# Patient Record
Sex: Male | Born: 1980 | Race: White | Hispanic: No | Marital: Single | Smoking: Current every day smoker
Health system: Southern US, Community
[De-identification: ages and names within clinical notes are randomized; demographics above are authoritative.]

## PROBLEM LIST (undated history)

## (undated) ENCOUNTER — Emergency Department: Admission: EM | Payer: Self-pay | Source: Home / Self Care

## (undated) DIAGNOSIS — J45909 Unspecified asthma, uncomplicated: Secondary | ICD-10-CM

## (undated) DIAGNOSIS — S022XXA Fracture of nasal bones, initial encounter for closed fracture: Secondary | ICD-10-CM

## (undated) DIAGNOSIS — M179 Osteoarthritis of knee, unspecified: Secondary | ICD-10-CM

## (undated) DIAGNOSIS — K5792 Diverticulitis of intestine, part unspecified, without perforation or abscess without bleeding: Secondary | ICD-10-CM

## (undated) DIAGNOSIS — R600 Localized edema: Secondary | ICD-10-CM

## (undated) DIAGNOSIS — I10 Essential (primary) hypertension: Secondary | ICD-10-CM

## (undated) DIAGNOSIS — F419 Anxiety disorder, unspecified: Secondary | ICD-10-CM

## (undated) DIAGNOSIS — M1990 Unspecified osteoarthritis, unspecified site: Secondary | ICD-10-CM

## (undated) DIAGNOSIS — Z720 Tobacco use: Secondary | ICD-10-CM

## (undated) HISTORY — PX: SHOULDER ARTHROSCOPY: SHX128

## (undated) HISTORY — PX: CHOLECYSTECTOMY: SHX55

---

## 2004-04-10 ENCOUNTER — Emergency Department: Payer: Self-pay | Admitting: Emergency Medicine

## 2005-10-27 ENCOUNTER — Emergency Department: Payer: Self-pay | Admitting: Emergency Medicine

## 2007-06-30 ENCOUNTER — Emergency Department: Payer: Self-pay | Admitting: Emergency Medicine

## 2008-02-06 ENCOUNTER — Emergency Department: Payer: Self-pay | Admitting: Emergency Medicine

## 2009-02-22 ENCOUNTER — Emergency Department: Payer: Self-pay | Admitting: Emergency Medicine

## 2009-04-06 ENCOUNTER — Emergency Department: Payer: Self-pay | Admitting: Internal Medicine

## 2009-10-17 ENCOUNTER — Emergency Department: Payer: Self-pay | Admitting: Emergency Medicine

## 2009-10-26 ENCOUNTER — Emergency Department: Payer: Self-pay | Admitting: Emergency Medicine

## 2010-01-09 ENCOUNTER — Emergency Department: Payer: Self-pay | Admitting: Emergency Medicine

## 2010-03-24 ENCOUNTER — Emergency Department: Payer: Self-pay | Admitting: Emergency Medicine

## 2010-04-04 ENCOUNTER — Emergency Department: Payer: Self-pay | Admitting: Emergency Medicine

## 2011-09-12 ENCOUNTER — Emergency Department: Payer: Self-pay | Admitting: Emergency Medicine

## 2011-09-13 ENCOUNTER — Emergency Department: Payer: Self-pay | Admitting: Internal Medicine

## 2012-04-14 ENCOUNTER — Emergency Department: Payer: Self-pay | Admitting: Emergency Medicine

## 2012-07-21 ENCOUNTER — Emergency Department: Payer: Self-pay | Admitting: Emergency Medicine

## 2012-07-21 LAB — URINALYSIS, COMPLETE
Bacteria: NONE SEEN
Blood: NEGATIVE
Ketone: NEGATIVE
Leukocyte Esterase: NEGATIVE
Ph: 8 (ref 4.5–8.0)
Protein: NEGATIVE
Specific Gravity: 1.02 (ref 1.003–1.030)

## 2012-07-21 LAB — CBC
HCT: 44.3 % (ref 40.0–52.0)
MCH: 31.5 pg (ref 26.0–34.0)
MCHC: 34.6 g/dL (ref 32.0–36.0)
Platelet: 232 10*3/uL (ref 150–440)
WBC: 13.9 10*3/uL — ABNORMAL HIGH (ref 3.8–10.6)

## 2012-07-21 LAB — COMPREHENSIVE METABOLIC PANEL
Albumin: 3.9 g/dL (ref 3.4–5.0)
Alkaline Phosphatase: 108 U/L (ref 50–136)
BUN: 13 mg/dL (ref 7–18)
Calcium, Total: 8.8 mg/dL (ref 8.5–10.1)
Chloride: 106 mmol/L (ref 98–107)
Co2: 28 mmol/L (ref 21–32)
Creatinine: 0.91 mg/dL (ref 0.60–1.30)
EGFR (Non-African Amer.): >60
Glucose: 174 mg/dL — ABNORMAL HIGH (ref 65–99)
Osmolality: 282 (ref 275–301)
Potassium: 4.2 mmol/L (ref 3.5–5.1)
SGPT (ALT): 141 U/L — ABNORMAL HIGH (ref 12–78)

## 2012-09-08 ENCOUNTER — Emergency Department: Payer: Self-pay | Admitting: Emergency Medicine

## 2012-09-08 LAB — COMPREHENSIVE METABOLIC PANEL
Albumin: 3.8 g/dL (ref 3.4–5.0)
Alkaline Phosphatase: 119 U/L (ref 50–136)
Calcium, Total: 9 mg/dL (ref 8.5–10.1)
Chloride: 110 mmol/L — ABNORMAL HIGH (ref 98–107)
EGFR (African American): >60
Glucose: 135 mg/dL — ABNORMAL HIGH (ref 65–99)
Osmolality: 283 (ref 275–301)
SGOT(AST): 57 U/L — ABNORMAL HIGH (ref 15–37)
SGPT (ALT): 101 U/L — ABNORMAL HIGH (ref 12–78)

## 2012-09-08 LAB — URINALYSIS, COMPLETE
Bacteria: NONE SEEN
Blood: NEGATIVE
Ketone: NEGATIVE
Leukocyte Esterase: NEGATIVE
Protein: NEGATIVE
Specific Gravity: 1.02 (ref 1.003–1.030)
WBC UR: 1 /HPF (ref 0–5)

## 2012-09-08 LAB — CBC
MCHC: 35 g/dL (ref 32.0–36.0)
MCV: 90 fL (ref 80–100)
Platelet: 205 10*3/uL (ref 150–440)
RBC: 4.77 10*6/uL (ref 4.40–5.90)

## 2012-10-18 ENCOUNTER — Emergency Department: Payer: Self-pay | Admitting: Emergency Medicine

## 2013-04-14 ENCOUNTER — Emergency Department: Payer: Self-pay | Admitting: Emergency Medicine

## 2013-06-19 ENCOUNTER — Emergency Department: Payer: Self-pay | Admitting: Internal Medicine

## 2013-06-23 LAB — BETA STREP CULTURE(ARMC)

## 2013-11-14 ENCOUNTER — Emergency Department: Payer: Self-pay | Admitting: Emergency Medicine

## 2013-11-14 LAB — BASIC METABOLIC PANEL
Anion Gap: 7 (ref 7–16)
BUN: 19 mg/dL — ABNORMAL HIGH (ref 7–18)
CHLORIDE: 107 mmol/L (ref 98–107)
CO2: 27 mmol/L (ref 21–32)
Calcium, Total: 8.9 mg/dL (ref 8.5–10.1)
Creatinine: 1.16 mg/dL (ref 0.60–1.30)
EGFR (African American): >60
EGFR (Non-African Amer.): >60
Glucose: 108 mg/dL — ABNORMAL HIGH (ref 65–99)
Osmolality: 284 (ref 275–301)
Potassium: 3.7 mmol/L (ref 3.5–5.1)
Sodium: 141 mmol/L (ref 136–145)

## 2013-11-14 LAB — CBC
HCT: 43.8 % (ref 40.0–52.0)
HGB: 14.6 g/dL (ref 13.0–18.0)
MCH: 31 pg (ref 26.0–34.0)
MCHC: 33.4 g/dL (ref 32.0–36.0)
MCV: 93 fL (ref 80–100)
PLATELETS: 184 10*3/uL (ref 150–440)
RBC: 4.72 10*6/uL (ref 4.40–5.90)
RDW: 12.7 % (ref 11.5–14.5)
WBC: 9.8 10*3/uL (ref 3.8–10.6)

## 2013-11-14 LAB — TROPONIN I

## 2013-11-15 LAB — HEPATIC FUNCTION PANEL A (ARMC)
ALT: 139 U/L — AB (ref 12–78)
Albumin: 4.1 g/dL (ref 3.4–5.0)
Alkaline Phosphatase: 104 U/L
Bilirubin,Total: 0.6 mg/dL (ref 0.2–1.0)
SGOT(AST): 66 U/L — ABNORMAL HIGH (ref 15–37)
Total Protein: 7.5 g/dL (ref 6.4–8.2)

## 2013-11-15 LAB — TROPONIN I: Troponin-I: <0.02 ng/mL

## 2013-11-15 LAB — LIPASE, BLOOD: LIPASE: 225 U/L (ref 73–393)

## 2013-11-16 ENCOUNTER — Observation Stay: Payer: Self-pay | Admitting: Surgery

## 2013-11-16 LAB — COMPREHENSIVE METABOLIC PANEL
Albumin: 3.5 g/dL (ref 3.4–5.0)
Alkaline Phosphatase: 99 U/L
Anion Gap: 6 — ABNORMAL LOW (ref 7–16)
BUN: 13 mg/dL (ref 7–18)
Bilirubin,Total: 0.5 mg/dL (ref 0.2–1.0)
Calcium, Total: 8.4 mg/dL — ABNORMAL LOW (ref 8.5–10.1)
Chloride: 108 mmol/L — ABNORMAL HIGH (ref 98–107)
Co2: 28 mmol/L (ref 21–32)
Creatinine: 0.85 mg/dL (ref 0.60–1.30)
EGFR (African American): >60
EGFR (Non-African Amer.): >60
GLUCOSE: 87 mg/dL (ref 65–99)
Osmolality: 283 (ref 275–301)
Potassium: 3.9 mmol/L (ref 3.5–5.1)
SGOT(AST): 55 U/L — ABNORMAL HIGH (ref 15–37)
SGPT (ALT): 116 U/L — ABNORMAL HIGH (ref 12–78)
Sodium: 142 mmol/L (ref 136–145)
Total Protein: 6.9 g/dL (ref 6.4–8.2)

## 2013-11-16 LAB — CBC WITH DIFFERENTIAL/PLATELET
BASOS ABS: 0.1 10*3/uL (ref 0.0–0.1)
Basophil %: 0.9 %
Eosinophil #: 0.8 10*3/uL — ABNORMAL HIGH (ref 0.0–0.7)
Eosinophil %: 9.3 %
HCT: 43.9 % (ref 40.0–52.0)
HGB: 14.5 g/dL (ref 13.0–18.0)
LYMPHS PCT: 26.6 %
Lymphocyte #: 2.4 10*3/uL (ref 1.0–3.6)
MCH: 30.6 pg (ref 26.0–34.0)
MCHC: 33.1 g/dL (ref 32.0–36.0)
MCV: 93 fL (ref 80–100)
MONO ABS: 0.7 x10 3/mm (ref 0.2–1.0)
Monocyte %: 8.3 %
NEUTROS PCT: 54.9 %
Neutrophil #: 4.9 10*3/uL (ref 1.4–6.5)
Platelet: 178 10*3/uL (ref 150–440)
RBC: 4.74 10*6/uL (ref 4.40–5.90)
RDW: 12.7 % (ref 11.5–14.5)
WBC: 8.9 10*3/uL (ref 3.8–10.6)

## 2013-11-18 LAB — HEPATIC FUNCTION PANEL A (ARMC)
ALBUMIN: 3.2 g/dL — AB (ref 3.4–5.0)
ALT: 143 U/L — AB
AST: 86 U/L — AB (ref 15–37)
Alkaline Phosphatase: 91 U/L
Bilirubin, Direct: <0.1 mg/dL (ref 0.00–0.20)
Bilirubin,Total: 0.4 mg/dL (ref 0.2–1.0)
TOTAL PROTEIN: 6.3 g/dL — AB (ref 6.4–8.2)

## 2013-11-18 LAB — CLOSTRIDIUM DIFFICILE(ARMC)

## 2013-11-19 LAB — WBCS, STOOL

## 2013-11-20 LAB — CREATININE, SERUM
Creatinine: 0.99 mg/dL (ref 0.60–1.30)
EGFR (African American): >60
EGFR (Non-African Amer.): >60

## 2013-11-24 LAB — PATHOLOGY REPORT

## 2014-04-24 ENCOUNTER — Emergency Department: Payer: Self-pay | Admitting: Emergency Medicine

## 2014-05-25 ENCOUNTER — Emergency Department: Payer: Self-pay | Admitting: Emergency Medicine

## 2014-08-20 NOTE — H&P (Signed)
Subjective/Chief Complaint epigastric abdominal pain and chest pain   History of Present Illness 34 y/o obese Wm presents to ER this weekend with sudden onset severe epigastric pain radiating into his sternal area and then later into mid-scapular region.  no fevers, no jaundice, sick contacts.  Not really any better since discharge from ER.  He has developed frequent diarrhea since ER 5-10 times per day. no blood or mucus.  He describes a similar episode of abdominal pain several months ago which resolved on its own.  He has a hx of diverticulitis dating back to 2014 treated as outpatient.  Endorses some nausea as well.  W/U in ER with US and lab work shows slightly elevated AST and ALT normal bilirubin with a contracted gallbladder containing multiple echogenic non-shadowing nonmobile.  CBS was 8 mm.  WBC was 9.8 platelet count 184,000 electrolytes unremarkable.   Past History obesity Diverticulitis Cholelithiasis Tobacco abuse and dependence. asthma   Past Med/Surgical Hx:  Hypertension:   asthma:   Shoulder Surgery - Right: orthocopic surgery  ALLERGIES:  Hydrocodone/ APAP: Itching, Hives, Rash  HOME MEDICATIONS: Medication Instructions Status  traMADol 50 mg oral tablet 1 tab(s) orally every 6 hours as needed for pain Active  lisinopril 10 mg oral tablet 1 tab(s) orally once a day Active  meloxicam 7.5 mg oral tablet 1 tab(s) orally once a day Active  pantoprazole 40 mg oral delayed release tablet 1 tab(s) orally once a day Active   Family and Social History:  Family History Non-Contributory   Social History positive  tobacco, negative ETOH, negative Illicit drugs   + Tobacco Current (within 1 year)   Place of Living Home   Review of Systems:  Subjective/Chief Complaint see above   Sputum No   Abdominal Pain Yes   Diarrhea Yes   Nausea/Vomiting Yes   SOB/DOE Yes   Physical Exam:  GEN obese, disheveled   HEENT pale conjunctivae, PERRL   NECK supple  No  masses   RESP normal resp effort  clear BS   CARD regular rate   ABD positive tenderness  no liver/spleen enlargement  no hernia  soft  distended  significant tenderness in the epigastric region.   EXTR negative cyanosis/clubbing, negative edema   SKIN normal to palpation, No rashes   NEURO cranial nerves intact   PSYCH A+O to time, place, person, good insight   Radiology Results: XRay:    19-Jul-15 22:43, Chest PA and Lateral  Chest PA and Lateral  REASON FOR EXAM:    chest pain  -  ed waiting room  COMMENTS:       PROCEDURE: DXR - DXR CHEST PA (OR AP) AND LATERAL  - Nov 14 2013 10:43PM     CLINICAL DATA:  Chest pain.    EXAM:  CHEST  2 VIEW    COMPARISON:  Chest radiograph from 02/22/2009    FINDINGS:  The lungs are well-aerated. Minimal left basilar opacity likely  reflects atelectasis. There is no evidence of pleural effusion or  pneumothorax.    The heart is normal in size; the mediastinal contour is within  normal limits. No acute osseous abnormalities are seen.     IMPRESSION:  Minimal left basilar opacity likely reflects atelectasis; lungs  otherwise clear.      Electronically Signed    By: Roanna RaiderJeffery  Chang M.D.    On: 11/14/2013 22:59       Verified By: JEFFREY . Cherly HensenHANG, M.D.,  US:    20-Jul-15  04:56, US Abdomen Limited Survey  US Abdomen Limited Survey  REASON FOR EXAM:    ruq pain  COMMENTS:   Body Site: Gallbladder, Liver, Common Bile Duct    PROCEDURE: [place]  - US ABDOMEN LIMITED SURVEY  - Nov 15 2013  4:56AM     CLINICAL DATA:  Right upper quadrant pain    EXAM:  US ABDOMEN LIMITED - RIGHT UPPER QUADRANT    COMPARISON:  Prior CT from 09/08/2012    FINDINGS:  Gallbladder:  Gallbladder is contracted. No gallbladder wall thickening or  pericholecystic fluid. Multiple round echogenic nonmobile foci are  seen within the gallbladder lumen, some of which appear adherent to  the gallbladder wall. Findings may represent small stones or  possibly  polyps. The largest of these measured approximately 8 mm.  No associated vascularity. Sonographic Murphy's sign was absent.    Common bile duct:    Diameter: 8 mm.  No obstructive stone identified.    Liver:    No focal lesion identified. Within normal limits in parenchymal  echogenicity.   IMPRESSION:  1. Contracted gallbladder with multiple echogenic, non shadowing,  nonmobile foci within the gallbladder lumen, several of which appear  intimately associated with the gallbladder wall. Findings may  reflect gallbladder polyps and/or small stones. A short interval  follow-up study in 3-6 months is recommended to document stability.  2. No sonographic evidence of acute cholecystitis.  3. Mild dilatation of the common bile duct to 8 mm. No  choledocholithiasis or intrahepatic biliary dilatation identified.      Electronically Signed    By: Rise Mu M.D.    On: 11/15/2013 05:27     Verified By: Fonda Kinder, M.D.,  LabUnknown:    19-Jul-15 22:43, Chest PA and Lateral  PACS Image    20-Jul-15 04:56, US Abdomen Limited Survey  PACS Image    Assessment/Admission Diagnosis 34 year old male with obesity and epigastric pain and cholelithiasis.   Plan plan admission to the hospital hydration and intravenous fluids CT scan of the abdomen and I like to make sure he doesn't have peptic ulcer disease given his presentation.  I will discuss this with the day surgeon Dr. Michela Pitcher.   Electronic Signatures: Natale Lay (MD)  (Signed 21-Jul-15 11:03)  Authored: CHIEF COMPLAINT and HISTORY, PAST MEDICAL/SURGIAL HISTORY, ALLERGIES, HOME MEDICATIONS, FAMILY AND SOCIAL HISTORY, REVIEW OF SYSTEMS, PHYSICAL EXAM, Radiology, ASSESSMENT AND PLAN   Last Updated: 21-Jul-15 11:03 by Natale Lay (MD)

## 2014-08-20 NOTE — Consult Note (Signed)
PATIENT NAME:  Anthony [name], Anthony [name] MR#:  [id number] DATE OF BIRTH:  Dec 16, 1980  GASTROINTESTINAL CONSULTATION   DATE OF CONSULTATION:  11/17/2013  REFERRING PHYSICIAN:  Carmie End, [name] CONSULTING PHYSICIAN:  Anthony Arrow, NP  PRIMARY CARE PHYSICIAN: Anthony A. Lacie Scotts, [name]  CONSULTING GASTROENTEROLOGIST: Anthony Minium, [name]  REASON FOR CONSULTATION: Epigastric and chest pain and nausea.   HISTORY OF PRESENT ILLNESS: Anthony [name] is a 34 year old Caucasian male who was in his usual state of health, with history of chronic GERD, until 3 days ago, when he developed severe epigastric pain upon awakening. He reports the pain was initially a 3 out of 10 and became progressively worse throughout the day. The next morning, the pain was up to 9 out of 10. The pain did radiate to his back between his shoulder blades. He describes the pain as stabbing, and it is constant. It is better now, but worse with movement and breathing. It is not associated with eating. He does have nausea, without any vomiting. He has a history of chronic heartburn and indigestion that are well-controlled on pantoprazole 40 mg daily at home. He denies any history of EGD. He has had a couple of loose stools. The last couple days, he has just had 1 loose stool. He denies any rectal bleeding, melena or clay-colored stool. He denies any  dysphagia or odynophagia. He denies any jaundice or pruritus. He denies any fever or chills. He does take Goody's Powders about every other day for headaches. He denies any other NSAIDs. He did have an abdominal ultrasound on 11/15/2013 which showed an 8 mm common bile duct, contracted gallbladder with multiple echogenic, non-shadowing, nonmobile foci within the gallbladder lumen, several of which appear with gallbladder wall, questionable polyps versus small lithiasis, and short interval followup is recommended. There was no intrahepatic biliary dilatation. He had a CT scan of the abdomen and pelvis with  IV contrast on 11/16/2013 which was normal, with a normal gallbladder.   PAST MEDICAL AND SURGICAL HISTORY: Chronic GERD, asthma, hypertension, right shoulder surgery.   MEDICATIONS PRIOR TO ADMISSION:  1. Pantoprazole 40 mg daily. 2. Goody's Powders p.r.n. 3. Lisinopril 10 mg daily.  4. Meloxicam 7.5 mg daily.  5. Tramadol 50 mg q.6 hours p.r.n.   ALLERGIES: HYDROCODONE CAUSES HIVES AND PRURITUS.   FAMILY HISTORY: There is no family history of colorectal carcinoma or other chronic GI problems.   SOCIAL HISTORY: He lives with his fiancee. He has 2 healthy children. He is a Hospital doctor for [employer]. He has a 20-pack-year history of tobacco use, but denies any alcohol use. Denies any illicit drug use.   REVIEW OF SYSTEMS: See HPI. Otherwise, negative 10-point review of systems.   PHYSICAL EXAMINATION:  VITAL SIGNS: Temperature 97.6, pulse 68, respirations 18, blood pressure 115/72, O2 saturation 95% on room air, BMI is 42, his weight is 310, his height is 72 inches.  GENERAL: He is an obese Caucasian male who is alert, oriented, pleasant, cooperative, in no acute distress.  HEENT: Sclerae clear, nonicteric. Conjunctivae pink. Oropharynx pink and moist without any lesions.  NECK: Supple, without mass or thyromegaly.  CHEST: Heart regular rate and rhythm. Normal S1, S2. No murmurs, rubs, clicks or gallops.  LUNGS: Clear to auscultation bilaterally.  ABDOMEN: Protuberant. Positive bowel sounds x4. No bruits auscultated. Soft, nondistended. He does have mild tenderness to the epigastrium on deep palpation. He has Murphy's point tenderness. No rebound tenderness or guarding. No hepatosplenomegaly or mass. Exam is limited  given his body habitus.  EXTREMITIES: Without clubbing or edema bilaterally.  SKIN: Pink, warm and dry, without rash or jaundice.  NEUROLOGIC: Grossly intact.  MUSCULOSKELETAL: Good equal movement and strength bilaterally.  PSYCHIATRIC: Alert, cooperative, normal mood and  affect.   LABORATORY STUDIES: Chloride 108, anion gap 6, calcium 8.4, otherwise normal basic metabolic panel. His lipase was 225. Total protein 6.9, albumin 3.5, total bilirubin 0.5, alkaline phosphatase 99, AST 55, ALT 116. Troponin was negative. CBC was normal.   IMPRESSION: Anthony [name] is a very pleasant 34 year old Caucasian male with chronic gastroesophageal reflux disease, on proton pump inhibitor, and chronic Goody's Powder and meloxicam use, with acute onset of epigastric pain and nausea for the past 4 days. Abdominal ultrasound shows cholelithiasis and dilated common bile duct of 8 mm, without evidence of choledocholithiasis. His LFTs show mild bump in transaminases, normal alkaline phosphatase and bilirubin. CT was benign. He is going to need an EGD today with Anthony [name] to look for an upper gastrointestinal source of epigastric pain, like gastritis, peptic ulcer disease, eosinophilic esophagitis or poorly controlled gastroesophageal reflux disease. I have discussed the risks and benefits which include, but are not limited to bleeding, infection, perforation, drug reaction. He agrees with this plan, and consent was obtained. He may need further exploration of his dilated common bile duct as well, as he may have passed a stone, sludge or microlithiasis. He is n.p.o. His last p.o. intake was sips of water with pills this morning, and I have discussed his care with Dr. Midge Miniumarren [name].   PLAN:  1. N.p.o.  2. EGD with Anthony [name] today.  3. Continue supportive measures.  4. Agree with PPI daily. 5. Further recommendations to follow EGD.   ____________________________ Anthony ArrowKandice L. Willem Klingensmith, NP klj:lb D: 11/17/2013 11:33:11 ET T: 11/17/2013 11:54:30 ET JOB#: [id number]  cc: Anthony ArrowKandice L. Jull Harral, NP, <Dictator> Anthony [name] Anthony [name] ELECTRONICALLY SIGNED 11/21/2013 20:51

## 2014-08-20 NOTE — Consult Note (Signed)
Brief Consult Note: Diagnosis: epigastric pain & nausea.   Patient was seen by consultant.   Comments: Mr. Anthony [name] is a very pleasant 34 y/o caucasian male with chronic GERD on PPI & chronic Goody's powder use & meloxicam with acute onset epigastric pain & nausea for past 4 days.  Abdominal ultrasound shows cholelithiasis & dilated CBD 8mm.  LFTS show mild bump in transaminases, normal ALP & bilirubin, without evidence of choledocholithiasis.  EGD to look for upper GI source of epigastric pain like gastritis, PUD, eosinophilic esophagitis or poorly controlled GERD.  Discussed risks/benefits of procedure which include but are not limited to bleeding, infection, perforation & drug reaction.  Patient agrees with this plan & consent will be obtained. May need further exploration of dilated CBD as he may have passed stone, sludge or microlithiasis as well.  Pt NPO, last PO was sips of water with pills this AM.  I have discussed her care [facility]EN [facility] & our plan of care is below.   Plan: 1) NPO 2) EGD with Dr Servando SnareWohl today 3) supportive measures 4) PPI daily Thanks for allowing us to participate in his care.  Please see full dictated note. #[id number].  Electronic Signatures: Joselyn ArrowJones, Paisley Grajeda L (NP)  (Signed 22-Jul-15 11:33)  Authored: Brief Consult Note   Last Updated: 22-Jul-15 11:33 by Joselyn ArrowJones, Oluwatimileyin Vivier L (NP)

## 2014-08-20 NOTE — Discharge Summary (Signed)
PATIENT NAME:  Anthony HolsterFORTNER, Celestino E MR#:  [id number] DATE OF BIRTH:  07/08/1980  DATE OF ADMISSION:  11/16/2013 DATE OF DISCHARGE:  11/20/2013  BRIEF HISTORY: Raeanne Gathersorman Jorstad is a 34 year old gentleman admitted to the office on referral from the Emergency Room with abdominal pain and possible biliary tract disease. He had atypical biliary tract symptoms with primarily left lower quadrant pains, diarrhea, and subxiphoid pain.  Emergency Room evaluation demonstrated slightly elevated liver function studies and SGOT of 66 and SGPT 139. White blood cell count was not elevated.  Bilirubin and alkaline phosphatase ran elevated. Ultrasound did demonstrate a dilated common bile duct. He was admitted to the hospital for further evaluation through the office as his pain was not controlled with outpatient medications. CT scan did not demonstrate any evidence of significant pulmonary tract disease, but he continued to complain of discomfort. EGD was performed by the GI service, which did not reveal any evidence of significant esophageal, gastric, or duodenal problems. He underwent a HIDA scan which demonstrated normal filling of the gallbladder and normal emptying. However, the ultrasound demonstrated possible polyps, and then in view of his  known symptoms and gallbladder disease, the patient requested surgical intervention. We appraised him of the fact that he may not improve with surgery, but certainly would be prevented if he chose that option. After appropriate preoperative preparation and informed consent, he underwent laparoscopic cholecystectomy with cholangiography on July 24. The procedure was uncomplicated. No significant intraoperative problems. Postoperatively, he is up, active, tolerating a diet with no particular complaints. His wounds look good. He is ambulating easily. We will discharge him home today to be followed in the office in 7-10 days' time. Bathing, activity, and driving instructions were given to the  patient.   DISCHARGE MEDICATIONS: He is to resume his home medications include lisinopril 10 mg p.o. daily, Protonix 40 mg p.o. daily, tramadol 50 mg p.o. every 6 hours p.r.n., and oxycodone/acetaminophen 5/325 every 4- 6 hours p.r.n.   FINAL DISCHARGE DIAGNOSIS: Abdominal pain on chronic cholecystitis.   SURGERY: Laparoscopic cholecystectomy with cholangiography.   ____________________________ Carmie Endalph L. Ely III, MD rle:ts D: 11/20/2013 11:57:01 ET T: 11/20/2013 14:32:59 ET JOB#: [id number]  cc: Carmie Endalph L. Ely III, MD, <Dictator> Midge Miniumarren Wohl, MD Quentin OreALPH L ELY MD ELECTRONICALLY SIGNED 11/20/2013 18:37

## 2014-08-20 NOTE — Consult Note (Signed)
Chief Complaint:  Subjective/Chief Complaint Pt denies abdominal pain currently.  Denies nausea, vomiting or diarrhea.  Pt exacerbated by stting upright or walking.  Pain unchanged with meals.   VITAL SIGNS/ANCILLARY NOTES: **Vital Signs.:   23-Jul-15 12:20  Temperature Temperature (F) 97.7  Celsius 36.5  Temperature Source oral  Pulse Pulse 68  Respirations Respirations 20  Systolic BP Systolic BP 128  Diastolic BP (mmHg) Diastolic BP (mmHg) 77  Mean BP 94  Pulse Ox % Pulse Ox % 94  Pulse Ox Activity Level  At rest  Oxygen Delivery Room Air/ 21 %   Brief Assessment:  GEN well developed, no acute distress, obese, A/Ox3   Cardiac Regular   Respiratory normal resp effort   Gastrointestinal Normal   Gastrointestinal details normal Soft  Nontender  Nondistended  Bowel sounds normal  No rebound tenderness  No gaurding  Negative Carnett sign   EXTR negative cyanosis/clubbing, negative edema   Additional Physical Exam Skin: no jaundice, pink, warm, dry   Lab Results: Hepatic:  23-Jul-15 04:28   Bilirubin, Total 0.4  Bilirubin, Direct < 0.1 (Result(s) reported on 18 Nov 2013 at 05:40AM.)  Alkaline Phosphatase 91 (46-116 NOTE: New Reference Range 11/16/13)  SGPT (ALT)  143 (14-63 NOTE: New Reference Range 11/16/13)  SGOT (AST)  86  Total Protein, Serum  6.3  Albumin, Serum  3.2   Assessment/Plan:  Assessment/Plan:  Assessment LA Grade A esophagitis: BID PPI Epigastric pain:  Pain atypical for biliary etiology.  He does have dilated CBD.  HIDA pending & whether HIDA exacerbates pain may help differentiate.  I have discussed his care with Dr Michela PitcherEly.   Diarrhea: resolved.  C diff negative.   Plan 1. Protonix 40mg  BID for 3 months, then daily 2. Follow up HIDA 3. Continue supportive measures Please call if you have any questions or concerns   Electronic Signatures: Joselyn ArrowJones, Samanthia Howland L (NP)  (Signed 23-Jul-15 12:44)  Authored: Chief Complaint, VITAL SIGNS/ANCILLARY NOTES,  Brief Assessment, Lab Results, Assessment/Plan   Last Updated: 23-Jul-15 12:44 by Joselyn ArrowJones, Damontre Millea L (NP)

## 2014-08-20 NOTE — Op Note (Signed)
PATIENT NAME:  Anthony [name], Anthony [name] MR#:  [id number] DATE OF BIRTH:  01/10/1981  DATE OF PROCEDURE:  11/19/2013  PREOPERATIVE DIAGNOSIS: Chronic cholecystitis.   POSTOPERATIVE DIAGNOSIS:  Chronic cholecystitis.  PROCEDURE: Laparoscopic cholecystectomy with cholangiography.   SURGEON: Quentin Orealph L. Ely, MD.   ANESTHESIA: General.   OPERATIVE PROCEDURE: With the patient in the supine position and after appropriate general anesthesia, the patient's abdomen was prepped with ChloraPrep and draped with sterile towels. The patient was placed in the head down, feet up position. A small infraumbilical incision was made in the standard fashion and carried down bluntly through the subcutaneous tissue. A Veress needle was used to cannulate the peritoneal cavity. CO2 was insufflated through appropriate pressure measurements. When approximately 2.5 liters of CO2 were instilled, the Veress needle was withdrawn. An 11 mm [employer] port was inserted into the peritoneal cavity.   Intraperitoneal position was confirmed. CO2 was reinsufflated. The patient was placed in the head up, feet down position and rotated slightly to the left side. A subxiphoid transverse incision was made and an 11 mm port was inserted under direct vision. Two lateral ports, 5 mm in size, were inserted under direct vision. The gallbladder was grasped and retracted superiorly and laterally. There were multiple adhesions to the gallbladder. He is resected down with combination of blunt and Bovie dissection, exposing the cystic duct. The cystic duct was clipped on the gallbladder side and opened.   An on table cholangiogram using dynamic fluoroscopy revealed free flow of dye into the duodenum.  No evidence of any enlarged bile duct. Intrahepatic radicles were seen. The catheter was withdrawn. The cystic duct was doubly clipped on the common duct side and divided. Cystic artery was doubly clipped and divided. The gallbladder was then dissected free  from its bed and liver using hook and cautery apparatus.   Once, gallbladder was free, it was grasped, and an Endo Catch apparatus removed through the upper midline incision. The area was copiously suction irrigated. The abdomen was desufflated. All incisions were closed with 5-0 nylon after removing the ports.  The area was infiltrated with 0.25% Marcaine for postoperative pain control. Sterile dressings were applied. The patient was returned to the recovery room, having tolerated the procedure well. Sponge, instrument and needle counts were correct x2 in operating room.     ____________________________ Quentin Orealph L. Ely III, MD rle:ds D: 11/19/2013 15:01:21 ET T: 11/19/2013 21:07:18 ET JOB#: [id number]  cc: Quentin Orealph L. Ely III, MD, <Dictator> Midge Miniumarren Wohl, MD Quentin OreALPH L ELY MD ELECTRONICALLY SIGNED 11/20/2013 18:37

## 2014-08-31 ENCOUNTER — Emergency Department: Payer: BLUE CROSS/BLUE SHIELD

## 2014-08-31 ENCOUNTER — Emergency Department
Admission: EM | Admit: 2014-08-31 | Discharge: 2014-08-31 | Disposition: A | Discharge status: Home / Self Care | Payer: BLUE CROSS/BLUE SHIELD | Attending: Emergency Medicine | Admitting: Emergency Medicine

## 2014-08-31 DIAGNOSIS — Z720 Tobacco use: Secondary | ICD-10-CM | POA: Insufficient documentation

## 2014-08-31 DIAGNOSIS — R0981 Nasal congestion: Secondary | ICD-10-CM | POA: Diagnosis present

## 2014-08-31 DIAGNOSIS — H9202 Otalgia, left ear: Secondary | ICD-10-CM | POA: Diagnosis not present

## 2014-08-31 DIAGNOSIS — Z7952 Long term (current) use of systemic steroids: Secondary | ICD-10-CM | POA: Insufficient documentation

## 2014-08-31 DIAGNOSIS — J45901 Unspecified asthma with (acute) exacerbation: Secondary | ICD-10-CM | POA: Diagnosis not present

## 2014-08-31 DIAGNOSIS — Z79899 Other long term (current) drug therapy: Secondary | ICD-10-CM | POA: Insufficient documentation

## 2014-08-31 DIAGNOSIS — J069 Acute upper respiratory infection, unspecified: Secondary | ICD-10-CM

## 2014-08-31 DIAGNOSIS — I10 Essential (primary) hypertension: Secondary | ICD-10-CM | POA: Diagnosis not present

## 2014-08-31 DIAGNOSIS — J40 Bronchitis, not specified as acute or chronic: Secondary | ICD-10-CM

## 2014-08-31 DIAGNOSIS — Z791 Long term (current) use of non-steroidal anti-inflammatories (NSAID): Secondary | ICD-10-CM | POA: Diagnosis not present

## 2014-08-31 HISTORY — DX: Unspecified asthma, uncomplicated: J45.909

## 2014-08-31 HISTORY — DX: Essential (primary) hypertension: I10

## 2014-08-31 MED ORDER — PSEUDOEPH-BROMPHEN-DM 30-2-10 MG/5ML PO SYRP: 5.0000 mL | ORAL_SOLUTION | Freq: Four times a day (QID) | ORAL | Status: DC | PRN

## 2014-08-31 MED ORDER — IPRATROPIUM-ALBUTEROL 0.5-2.5 (3) MG/3ML IN SOLN
3.0000 mL | Freq: Once | RESPIRATORY_TRACT | Status: AC
  Administered 2014-08-31: 3 mL via RESPIRATORY_TRACT

## 2014-08-31 MED ORDER — PREDNISONE 20 MG PO TABS
ORAL_TABLET | ORAL | Status: AC
  Filled 2014-08-31: qty 3

## 2014-08-31 MED ORDER — IPRATROPIUM-ALBUTEROL 0.5-2.5 (3) MG/3ML IN SOLN
RESPIRATORY_TRACT | Status: AC
  Filled 2014-08-31: qty 3

## 2014-08-31 MED ORDER — PREDNISONE 20 MG PO TABS
60.0000 mg | ORAL_TABLET | Freq: Once | ORAL | Status: AC
  Administered 2014-08-31: 60 mg via ORAL

## 2014-08-31 MED ORDER — PREDNISONE 20 MG PO TABS: 20.0000 mg | ORAL_TABLET | Freq: Every day | ORAL | Status: DC

## 2014-08-31 MED ORDER — IPRATROPIUM-ALBUTEROL 0.5-2.5 (3) MG/3ML IN SOLN
RESPIRATORY_TRACT | Status: AC
  Administered 2014-08-31: 3 mL via RESPIRATORY_TRACT
  Filled 2014-08-31: qty 3

## 2014-08-31 MED ORDER — ALBUTEROL SULFATE HFA 108 (90 BASE) MCG/ACT IN AERS: 2.0000 | INHALATION_SPRAY | Freq: Four times a day (QID) | RESPIRATORY_TRACT | Status: DC | PRN

## 2014-08-31 NOTE — ED Provider Notes (Signed)
[facility] Emergency Department Provider Note    ____________________________________________  Time seen: ----------------------------------------- 7:10 PM on 08/31/2014 -----------------------------------------   I have reviewed the triage vital signs and the nursing notes.   HISTORY  Chief Complaint Nasal Congestion   HPI Anthony [name] is a 34 y.o. male presents for 2-3 days of nasal congestion and cough. States cough is worse at night with drainage. States occasional wheezing only when coughing. denies shortness of breath or chest pain. States similar to history with allergies with his asthma. Reports multiple people in household with similar complaints.States " I think I have a virus."     Past Medical History  Diagnosis Date  . Hypertension   . High cholesterol   . Asthma     There are no active problems to display for this patient.   Past Surgical History  Procedure Laterality Date  . Shoulder arthroscopy    . Cholecystectomy      Current Outpatient Rx  Name  Route  Sig  Dispense  Refill  . hydrochlorothiazide (HYDRODIURIL) 25 MG tablet   Oral   Take 25 mg by mouth daily.         Marland Kitchen. lisinopril (PRINIVIL,ZESTRIL) 10 MG tablet   Oral   Take 10 mg by mouth daily.         . meloxicam (MOBIC) 7.5 MG tablet   Oral   Take 7.5 mg by mouth daily.         . pantoprazole (PROTONIX) 40 MG tablet   Oral   Take 40 mg by mouth daily.         Marland Kitchen. albuterol (PROVENTIL HFA;VENTOLIN HFA) 108 (90 BASE) MCG/ACT inhaler   Inhalation   Inhale 2 puffs into the lungs every 6 (six) hours as needed for wheezing or shortness of breath.   1 Inhaler   0   . brompheniramine-pseudoephedrine-DM 30-2-10 MG/5ML syrup   Oral   Take 5 mLs by mouth 4 (four) times daily as needed (cough and congestion).   100 mL   0   . predniSONE (DELTASONE) 20 MG tablet   Oral   Take 1 tablet (20 mg total) by mouth daily with breakfast.   5 tablet   0      Allergies Hydrocodone  No family history on file.  Social History History  Substance Use Topics  . Smoking status: Current Every Day Smoker -- 1.00 packs/day    Types: Cigarettes  . Smokeless tobacco: Not on file  . Alcohol Use: No    Review of Systems  Constitutional: Negative for fever.States occasional chills. 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 Eyes: Negative for visual changes. ENT: Reports runny nose, nasal congestion, left ear ache intermittent. Denies sore throat, difficulty swallowing. States continue to eat and drink well.  Cardiovascular: Negative for chest pain. Respiratory: Negative for shortness of breath. States occasional wheezing. States wheezes only with cough. Gastrointestinal: Negative for abdominal pain, vomiting and diarrhea. Genitourinary: Negative for dysuria. Musculoskeletal: Negative for back pain. Skin: Negative for rash. Neurological: Negative for headaches, focal weakness or numbness.   10-point ROS otherwise negative.  ____________________________________________   PHYSICAL EXAM:  VITAL SIGNS: ED Triage Vitals  Enc Vitals Group     BP 08/31/14 1833 143/82 mmHg     Pulse Rate 08/31/14 1833 105     Resp 08/31/14 1833 20     Temp 08/31/14 1833 98.1 F (36.7 C)     Temp Source 08/31/14 1833 Oral  SpO2 08/31/14 1833 98 %     Weight 08/31/14 1833 300 lb (136.079 kg)     Height 08/31/14 1833 6' (1.829 m)     Head Cir --      Peak Flow --      Pain Score 08/31/14 1835 5     Pain Loc --      Pain Edu? --      Excl. in GC? --      Constitutional: Alert and oriented. Well appearing and in no distress. Eyes: Conjunctivae are normal. PERRL. Normal extraocular movements. ENT   Head: Normocephalic and atraumatic.   Nose: mild rhinorrhea and nasal mucous inflammation   Mouth/Throat: Mucous membranes are moist.   Neck: No stridor. No pharyngeal erythema, swelling or exudate.   Hematological/Lymphatic/Immunilogical: No cervical lymphadenopathy. Cardiovascular: Normal rate, regular rhythm. Normal and symmetric distal pulses are present in all extremities. No murmurs, rubs, or gallops. Respiratory: Mild right lower wheezes.  Normal respiratory effort without tachypnea nor retractions. Breath sounds are clear and equal bilaterally.  Gastrointestinal: Soft and nontender. No distention. No abdominal bruits. There is no CVA tenderness. Genitourinary: deferred Musculoskeletal: Nontender with normal range of motion in all extremities. No joint effusions.  No lower extremity tenderness nor edema. Neurologic:  Normal speech and language. No gross focal neurologic deficits are appreciated. Speech is normal. No gait instability. Skin:  Skin is warm, dry and intact. No rash noted. Psychiatric: Mood and affect are normal. Speech and behavior are normal. Patient exhibits appropriate insight and judgment.  ____________________________________________    RADIOLOGY  EXAM: CHEST 2 VIEW  COMPARISON: 11/14/2013.  FINDINGS: The heart size and mediastinal contours are stable. There is slightly improved aeration of the lung bases. Mild chronic central airway thickening and basilar atelectasis are present without confluent airspace opacity. There is no pleural effusion. The bones appear unremarkable.  IMPRESSION: Mild chronic changes with interval improved aeration of the lung bases. No acute cardiopulmonary process.   Electronically Signed  By: Carey BullocksWilliam Veazey M.D.  On: 08/31/2014 19:19   ____________________________________________   INITIAL IMPRESSION / ASSESSMENT AND PLAN / ED COURSE  Pertinent labs & imaging results that were available during my care of the patient were reviewed by me and considered in my medical decision making (see chart for details).  Well appearing. Complains of URI s/s for 2-3 days.  Will treat with albuterol in ER and obtain chest  xray.  2035: Chest xray negative for acute changes. Wheezes resolved in ER after albuterol/ipratropium neb. Reports feeling much improved.Follow up with PCP. Discussed f/u and return parameters. Pt agreed to plan.    ____________________________________________   FINAL CLINICAL IMPRESSION(S) / ED DIAGNOSES  Final diagnoses:  Upper respiratory infection  Bronchitis    Renford DillsLindsey Jahleah Mariscal, NP 08/31/14 [id number]  Phineas SemenGraydon Goodman, MD 08/31/14 2326

## 2014-08-31 NOTE — Discharge Instructions (Signed)
Take medication as prescribed. Rest. Drink plenty of water.   Follow up with your primary care physician in 2-3 days.   Return to ER for new or worsening concerns.   Upper Respiratory Infection, Adult An upper respiratory infection (URI) is also known as the common cold. It is often caused by a type of germ (virus). Colds are easily spread (contagious). You can pass it to others by kissing, coughing, sneezing, or drinking out of the same glass. Usually, you get better in 1 or 2 weeks.  HOME CARE   Only take medicine as told by your doctor.  Use a warm mist humidifier or breathe in steam from a hot shower.  Drink enough water and fluids to keep your pee (urine) clear or pale yellow.  Get plenty of rest.  Return to work when your temperature is back to normal or as told by your doctor. You may use a face mask and wash your hands to stop your cold from spreading. GET HELP RIGHT AWAY IF:   After the first few days, you feel you are getting worse.  You have questions about your medicine.  You have chills, shortness of breath, or brown or red spit (mucus).  You have yellow or brown snot (nasal discharge) or pain in the face, especially when you bend forward.  You have a fever, puffy (swollen) neck, pain when you swallow, or white spots in the back of your throat.  You have a bad headache, ear pain, sinus pain, or chest pain.  You have a high-pitched whistling sound when you breathe in and out (wheezing).  You have a lasting cough or cough up blood.  You have sore muscles or a stiff neck. MAKE SURE YOU:   Understand these instructions.  Will watch your condition.  Will get help right away if you are not doing well or get worse. Document Released: 10/02/2007 Document Revised: 07/08/2011 Document Reviewed: 07/21/2013 [facility] Patient Information 2015 [place], [place]. This information is not intended to replace advice given to you by your health care provider. Make sure you  discuss any questions you have with your health care provider.

## 2014-08-31 NOTE — ED Notes (Signed)
Pt reports nasal congestion and left ear pain.  Reports sneezing a lot.  Reports cough but not productive.  Denies fever but complains of chills.

## 2014-08-31 NOTE — ED Notes (Signed)
Pt with sneezing and coughing for 3 days.  Head congestion.  Sounds nasally congested.  np in seeing patient now.

## 2014-10-31 ENCOUNTER — Emergency Department
Admission: EM | Admit: 2014-10-31 | Discharge: 2014-10-31 | Disposition: A | Discharge status: Home / Self Care | Payer: BLUE CROSS/BLUE SHIELD | Attending: Emergency Medicine | Admitting: Emergency Medicine

## 2014-10-31 ENCOUNTER — Encounter: Payer: Self-pay | Admitting: *Deleted

## 2014-10-31 ENCOUNTER — Emergency Department: Payer: BLUE CROSS/BLUE SHIELD

## 2014-10-31 DIAGNOSIS — M545 Low back pain, unspecified: Secondary | ICD-10-CM

## 2014-10-31 DIAGNOSIS — Z720 Tobacco use: Secondary | ICD-10-CM | POA: Insufficient documentation

## 2014-10-31 DIAGNOSIS — Z79899 Other long term (current) drug therapy: Secondary | ICD-10-CM | POA: Diagnosis not present

## 2014-10-31 DIAGNOSIS — Z791 Long term (current) use of non-steroidal anti-inflammatories (NSAID): Secondary | ICD-10-CM | POA: Diagnosis not present

## 2014-10-31 DIAGNOSIS — R109 Unspecified abdominal pain: Secondary | ICD-10-CM | POA: Diagnosis not present

## 2014-10-31 DIAGNOSIS — I10 Essential (primary) hypertension: Secondary | ICD-10-CM | POA: Insufficient documentation

## 2014-10-31 LAB — URINALYSIS COMPLETE WITH MICROSCOPIC (ARMC ONLY)
BACTERIA UA: NONE SEEN
BILIRUBIN URINE: NEGATIVE
GLUCOSE, UA: NEGATIVE mg/dL
HGB URINE DIPSTICK: NEGATIVE
Ketones, ur: NEGATIVE mg/dL
LEUKOCYTES UA: NEGATIVE
NITRITE: NEGATIVE
PH: 7 (ref 5.0–8.0)
Protein, ur: NEGATIVE mg/dL
Specific Gravity, Urine: 1.02 (ref 1.005–1.030)
Squamous Epithelial / LPF: NONE SEEN
WBC, UA: NONE SEEN WBC/hpf (ref 0–5)

## 2014-10-31 MED ORDER — TRAMADOL HCL 50 MG PO TABS
100.0000 mg | ORAL_TABLET | Freq: Once | ORAL | Status: AC
  Administered 2014-10-31: 100 mg via ORAL

## 2014-10-31 MED ORDER — TRAMADOL HCL 50 MG PO TABS: 50.0000 mg | ORAL_TABLET | Freq: Four times a day (QID) | ORAL | Status: AC | PRN

## 2014-10-31 MED ORDER — TRAMADOL HCL 50 MG PO TABS
ORAL_TABLET | ORAL | Status: AC
  Administered 2014-10-31: 100 mg via ORAL
  Filled 2014-10-31: qty 2

## 2014-10-31 NOTE — ED Provider Notes (Signed)
[facility] Emergency Department Provider Note  Time seen: 4:15 PM  I have reviewed the triage vital signs and the nursing notes.   HISTORY  Chief Complaint Back Pain    HPI Sharee Holsterorman E Gervin II is a 34 y.o. male with a past medical history of hypertension, hyperlipidemia, asthma who presents the emergency department with right flank pain 2 weeks. According to the patient for the past 2 weeks he has had intermittent right flank pain, sometimes radiating to the right groin. Denies any nausea, vomiting, fever, constipation or diarrhea. Patient states he has had kidney stones once before to which this feels somewhat similar but denies any dysuria, hematuria.Patient describes his symptoms as moderate, no worse with movement or bending. No modifying factor identified.     Past Medical History  Diagnosis Date  . Hypertension   . High cholesterol   . Asthma     There are no active problems to display for this patient.   Past Surgical History  Procedure Laterality Date  . Shoulder arthroscopy    . Cholecystectomy      Current Outpatient Rx  Name  Route  Sig  Dispense  Refill  . albuterol (PROVENTIL HFA;VENTOLIN HFA) 108 (90 BASE) MCG/ACT inhaler   Inhalation   Inhale 2 puffs into the lungs every 6 (six) hours as needed for wheezing or shortness of breath.   1 Inhaler   0   . brompheniramine-pseudoephedrine-DM 30-2-10 MG/5ML syrup   Oral   Take 5 mLs by mouth 4 (four) times daily as needed (cough and congestion).   100 mL   0   . hydrochlorothiazide (HYDRODIURIL) 25 MG tablet   Oral   Take 25 mg by mouth daily.         Marland Kitchen. lisinopril (PRINIVIL,ZESTRIL) 10 MG tablet   Oral   Take 10 mg by mouth daily.         . meloxicam (MOBIC) 7.5 MG tablet   Oral   Take 7.5 mg by mouth daily.         . pantoprazole (PROTONIX) 40 MG tablet   Oral   Take 40 mg by mouth daily.         . predniSONE (DELTASONE) 20 MG tablet   Oral   Take 1 tablet  (20 mg total) by mouth daily with breakfast.   5 tablet   0     Allergies Hydrocodone  No family history on file.  Social History History  Substance Use Topics  . Smoking status: Current Every Day Smoker -- 1.00 packs/day    Types: Cigarettes  . Smokeless tobacco: Not on file  . Alcohol Use: No    Review of Systems Constitutional: Negative for fever. Cardiovascular: Negative for chest pain. Respiratory: Negative for shortness of breath. Gastrointestinal: Positive for right-sided abdominal pain. Genitourinary: Negative for dysuria. Negative for hematuria. Musculoskeletal: Right flank pain. Skin: Negative for rash. 10-point ROS otherwise negative.  ____________________________________________   PHYSICAL EXAM:  VITAL SIGNS: ED Triage Vitals  Enc Vitals Group     BP 10/31/14 1506 149/82 mmHg     Pulse Rate 10/31/14 1506 85     Resp 10/31/14 1506 20     Temp 10/31/14 1506 98.2 F (36.8 C)     Temp Source 10/31/14 1506 Oral     SpO2 10/31/14 1506 96 %     Weight 10/31/14 1506 305 lb (138.347 kg)     Height 10/31/14 1506 6' (1.829 m)     Head  Cir --      Peak Flow --      Pain Score 10/31/14 1507 8     Pain Loc --      Pain Edu? --      Excl. in GC? --     Constitutional: Alert and oriented. Well appearing  Eyes: Normal exam ENT   Mouth/Throat: Mucous membranes are moist. Cardiovascular: Normal rate, regular rhythm. No murmur Respiratory: Normal respiratory effort without tachypnea nor retractions. Breath sounds are clear and equal bilaterally. No wheezes/rales/rhonchi. Gastrointestinal: Soft and nontender. No distention.  There is no CVA tenderness. Musculoskeletal: Nontender with normal range of motion in all extremities. No lower extremity tenderness or edema. No back or SI joint tenderness. Neurologic:  Normal speech and language. No gross focal neurologic deficits are appreciated. Speech is normal. Skin:  Skin is warm, dry and intact.  Psychiatric:  Mood and affect are normal. Speech and behavior are normal. Patient exhibits appropriate insight and judgment.  ____________________________________________   RADIOLOGY  CT abdomen and pelvis shows no acute abnormalities.  ____________________________________________    INITIAL IMPRESSION / ASSESSMENT AND PLAN / ED COURSE  Pertinent labs & imaging results that were available during my care of the patient were reviewed by me and considered in my medical decision making (see chart for details).  Patient with 2 weeks of intermittent right flank pain occasionally radiating to the right groin. Patient does have a history of kidney stones in the past. Patient ranks his pain as moderate. 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   He states the new truck suspension is much harder and what he is used to, and it could be related to this. We will check a urinalysis, and CT renal scan to rule out a kidney stone. Overall the patient has a normal physical exam.   CT within normal limits, we'll place the patient on Ultram as needed for pain control for likely muscular skeletal pain and have the patient follow-up with his primary care doctor. Patient is agreeable to this plan.  ___________________________________________   FINAL CLINICAL IMPRESSION(S)  Right flank pain   Minna Antis, MD 10/31/14 1654

## 2014-10-31 NOTE — ED Notes (Signed)
Back pain past 2 weeks

## 2014-10-31 NOTE — Discharge Instructions (Signed)
Back Pain, Adult °Back pain is very common. The pain often gets better over time. The cause of back pain is usually not dangerous. Most people can learn to manage their back pain on their own.  °HOME CARE  °· Stay active. Start with short walks on flat ground if you can. Try to walk farther each day. °· Do not sit, drive, or stand in one place for more than 30 minutes. Do not stay in bed. °· Do not avoid exercise or work. Activity can help your back heal faster. °· Be careful when you bend or lift an object. Bend at your knees, keep the object close to you, and do not twist. °· Sleep on a firm mattress. Lie on your side, and bend your knees. If you lie on your back, put a pillow under your knees. °· Only take medicines as told by your doctor. °· Put ice on the injured area. °¨ Put ice in a plastic bag. °¨ Place a towel between your skin and the bag. °¨ Leave the ice on for 15-20 minutes, 03-04 times a day for the first 2 to 3 days. After that, you can switch between ice and heat packs. °· Ask your doctor about back exercises or massage. °· Avoid feeling anxious or stressed. Find good ways to deal with stress, such as exercise. °GET HELP RIGHT AWAY IF:  °· Your pain does not go away with rest or medicine. °· Your pain does not go away in 1 week. °· You have new problems. °· You do not feel well. °· The pain spreads into your legs. °· You cannot control when you poop (bowel movement) or pee (urinate). °· Your arms or legs feel weak or lose feeling (numbness). °· You feel sick to your stomach (nauseous) or throw up (vomit). °· You have belly (abdominal) pain. °· You feel like you may pass out (faint). °MAKE SURE YOU:  °· Understand these instructions. °· Will watch your condition. °· Will get help right away if you are not doing well or get worse. °Document Released: 10/02/2007 Document Revised: 07/08/2011 Document Reviewed: 08/17/2013 °ExitCare® Patient Information ©2015 [employer]. This information is not intended  to replace advice given to you by your health care provider. Make sure you discuss any questions you have with your health care provider. ° °

## 2014-11-02 ENCOUNTER — Emergency Department
Admission: EM | Admit: 2014-11-02 | Discharge: 2014-11-02 | Disposition: A | Discharge status: Home / Self Care | Payer: BLUE CROSS/BLUE SHIELD | Attending: Student | Admitting: Student

## 2014-11-02 ENCOUNTER — Encounter: Payer: Self-pay | Admitting: Urgent Care

## 2014-11-02 DIAGNOSIS — Z7952 Long term (current) use of systemic steroids: Secondary | ICD-10-CM | POA: Diagnosis not present

## 2014-11-02 DIAGNOSIS — Z791 Long term (current) use of non-steroidal anti-inflammatories (NSAID): Secondary | ICD-10-CM | POA: Diagnosis not present

## 2014-11-02 DIAGNOSIS — X58XXXD Exposure to other specified factors, subsequent encounter: Secondary | ICD-10-CM | POA: Diagnosis not present

## 2014-11-02 DIAGNOSIS — Z79899 Other long term (current) drug therapy: Secondary | ICD-10-CM | POA: Diagnosis not present

## 2014-11-02 DIAGNOSIS — Z720 Tobacco use: Secondary | ICD-10-CM | POA: Insufficient documentation

## 2014-11-02 DIAGNOSIS — I10 Essential (primary) hypertension: Secondary | ICD-10-CM | POA: Insufficient documentation

## 2014-11-02 DIAGNOSIS — S335XXD Sprain of ligaments of lumbar spine, subsequent encounter: Secondary | ICD-10-CM

## 2014-11-02 DIAGNOSIS — S338XXD Sprain of other parts of lumbar spine and pelvis, subsequent encounter: Secondary | ICD-10-CM | POA: Insufficient documentation

## 2014-11-02 DIAGNOSIS — S3992XD Unspecified injury of lower back, subsequent encounter: Secondary | ICD-10-CM | POA: Diagnosis present

## 2014-11-02 MED ORDER — CYCLOBENZAPRINE HCL 5 MG PO TABS: 5.0000 mg | ORAL_TABLET | Freq: Three times a day (TID) | ORAL | Status: DC | PRN

## 2014-11-02 MED ORDER — KETOROLAC TROMETHAMINE 10 MG PO TABS: 10.0000 mg | ORAL_TABLET | Freq: Three times a day (TID) | ORAL | Status: DC

## 2014-11-02 NOTE — ED Notes (Signed)
Patient presents with c/o lower back pain x 3 (+) weeks. Patient seen here on Monday for the same and was Rx'd Tramadol - reports that it "is not touching it."

## 2014-11-02 NOTE — Discharge Instructions (Signed)
Back Pain, Adult Back pain is very common. The pain often gets better over time. The cause of back pain is usually not dangerous. Most people can learn to manage their back pain on their own.  HOME CARE   Stay active. Start with short walks on flat ground if you can. Try to walk farther each day.  Do not sit, drive, or stand in one place for more than 30 minutes. Do not stay in bed.  Do not avoid exercise or work. Activity can help your back heal faster.  Be careful when you bend or lift an object. Bend at your knees, keep the object close to you, and do not twist.  Sleep on a firm mattress. Lie on your side, and bend your knees. If you lie on your back, put a pillow under your knees.  Only take medicines as told by your doctor.  Put ice on the injured area.  Put ice in a plastic bag.  Place a towel between your skin and the bag.  Leave the ice on for 15-20 minutes, 03-04 times a day for the first 2 to 3 days. After that, you can switch between ice and heat packs.  Ask your doctor about back exercises or massage.  Avoid feeling anxious or stressed. Find good ways to deal with stress, such as exercise. GET HELP RIGHT AWAY IF:   Your pain does not go away with rest or medicine.  Your pain does not go away in 1 week.  You have new problems.  You do not feel well.  The pain spreads into your legs.  You cannot control when you poop (bowel movement) or pee (urinate).  Your arms or legs feel weak or lose feeling (numbness).  You feel sick to your stomach (nauseous) or throw up (vomit).  You have belly (abdominal) pain.  You feel like you may pass out (faint). MAKE SURE YOU:   Understand these instructions.  Will watch your condition.  Will get help right away if you are not doing well or get worse. Document Released: 10/02/2007 Document Revised: 07/08/2011 Document Reviewed: 08/17/2013 [facility] Patient Information 2015 [place], [place]. This information is not intended  to replace advice given to you by your health care provider. Make sure you discuss any questions you have with your health care provider.  Lumbosacral Strain Lumbosacral strain is a strain of any of the parts that make up your lumbosacral vertebrae. Your lumbosacral vertebrae are the bones that make up the lower third of your backbone. Your lumbosacral vertebrae are held together by muscles and tough, fibrous tissue (ligaments).  CAUSES  A sudden blow to your back can cause lumbosacral strain. Also, anything that causes an excessive stretch of the muscles in the low back can cause this strain. This is typically seen when people exert themselves strenuously, fall, lift heavy objects, bend, or crouch repeatedly. RISK FACTORS  Physically demanding work.  Participation in pushing or pulling sports or sports that require a sudden twist of the back (tennis, golf, baseball).  Weight lifting.  Excessive lower back curvature.  Forward-tilted pelvis.  Weak back or abdominal muscles or both.  Tight hamstrings. SIGNS AND SYMPTOMS  Lumbosacral strain may cause pain in the area of your injury or pain that moves (radiates) down your leg.  DIAGNOSIS Your health care provider can often diagnose lumbosacral strain through a physical exam. In some cases, you may need tests such as X-ray exams.  TREATMENT  Treatment for your lower back injury depends  on many factors that your clinician will have to evaluate. However, most treatment will include the use of anti-inflammatory medicines. HOME CARE INSTRUCTIONS   Avoid hard physical activities (tennis, racquetball, waterskiing) if you are not in proper physical condition for it. This may aggravate or create problems.  If you have a back problem, avoid sports requiring sudden body movements. Swimming and walking are generally safer activities.  Maintain good posture.  Maintain a healthy weight.  For acute conditions, you may put ice on the injured  area.  Put ice in a plastic bag.  Place a towel between your skin and the bag.  Leave the ice on for 20 minutes, 2-3 times a day.  When the low back starts healing, stretching and strengthening exercises may be recommended. SEEK MEDICAL CARE IF:  Your back pain is getting worse.  You experience severe back pain not relieved with medicines. SEEK IMMEDIATE MEDICAL CARE IF:   You have numbness, tingling, weakness, or problems with the use of your arms or legs.  There is a change in bowel or bladder control.  You have increasing pain in any area of the body, including your belly (abdomen).  You notice shortness of breath, dizziness, or feel faint.  You feel sick to your stomach (nauseous), are throwing up (vomiting), or become sweaty.  You notice discoloration of your toes or legs, or your feet get very cold. MAKE SURE YOU:   Understand these instructions.  Will watch your condition.  Will get help right away if you are not doing well or get worse. Document Released: 01/23/2005 Document Revised: 04/20/2013 Document Reviewed: 12/02/2012 [facility] Patient Information 2015 [place], [place]. This information is not intended to replace advice given to you by your health care provider. Make sure you discuss any questions you have with your health care provider.  Hold your Meloxicam while dosing the Ketorolac.  Take the prescription Flexeril & Ketorolac as directed.  Follow-up with Dr. Hyacinth MeekerMiller or Dr. Lacie ScottsNiemeyer for further care and management.

## 2014-11-03 NOTE — ED Provider Notes (Signed)
[facility] Emergency Department Provider Note ____________________________________________  Time seen: 2230  I have reviewed the triage vital signs and the nursing notes.  HISTORY  Chief Complaint  Back Pain  HPI Anthony [name] is a 34 y.o. male returns to the ED with complaints of low back pain for the last 3-4 weeks. He was seen in the ED for the same complaint 3 days prior, and prescribed Ultram for his pain following negative abdominal/pelvis CT. He localizes pain to the sacral region of the spine, without referral into the lower extremities, incontinence, or foot drop. He reports that the tramadol is not affecting his pain at this time. He was referred to his primary doctor at the last visit, but has failed to make contact with the clinic. He is verbalizing his desire to understand what is wrong with his back. 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 He rates his pain at a 10 out of 10 in triage.  Past Medical History  Diagnosis Date  . Hypertension   . High cholesterol   . Asthma    There are no active problems to display for this patient.  Past Surgical History  Procedure Laterality Date  . Shoulder arthroscopy    . Cholecystectomy      Current Outpatient Rx  Name  Route  Sig  Dispense  Refill  . hydrochlorothiazide (HYDRODIURIL) 25 MG tablet   Oral   Take 25 mg by mouth daily.         Marland Kitchen. lisinopril (PRINIVIL,ZESTRIL) 10 MG tablet   Oral   Take 10 mg by mouth daily.         . meloxicam (MOBIC) 7.5 MG tablet   Oral   Take 7.5 mg by mouth daily.         . pantoprazole (PROTONIX) 40 MG tablet   Oral   Take 40 mg by mouth daily.         . traMADol (ULTRAM) 50 MG tablet   Oral   Take 1 tablet (50 mg total) by mouth every 6 (six) hours as needed.   20 tablet   0   . albuterol (PROVENTIL HFA;VENTOLIN HFA) 108 (90  BASE) MCG/ACT inhaler   Inhalation   Inhale 2 puffs into the lungs every 6 (six) hours as needed for wheezing or shortness of breath.   1 Inhaler   0   . brompheniramine-pseudoephedrine-DM 30-2-10 MG/5ML syrup   Oral   Take 5 mLs by mouth 4 (four) times daily as needed (cough and congestion).   100 mL   0   . cyclobenzaprine (FLEXERIL) 5 MG tablet   Oral   Take 1 tablet (5 mg total) by mouth 3 (three) times daily as needed for muscle spasms.   15 tablet   0   . ketorolac (TORADOL) 10 MG tablet   Oral   Take 1 tablet (10 mg total) by mouth every 8 (eight) hours.   15 tablet   0   . predniSONE (DELTASONE) 20 MG tablet   Oral   Take 1 tablet (20 mg total) by mouth daily with breakfast.   5 tablet   0    Allergies Hydrocodone  No family history on file.  Social History History  Substance Use Topics  . Smoking status: Current Every Day Smoker -- 1.00 packs/day    Types: Cigarettes  .  Smokeless tobacco: Not on file  . Alcohol Use: No   Review of Systems  Constitutional: Negative for fever. Eyes: Negative for visual changes. ENT: Negative for sore throat. Cardiovascular: Negative for chest pain. Respiratory: Negative for shortness of breath. Gastrointestinal: Negative for abdominal pain, vomiting and diarrhea. Genitourinary: Negative for dysuria. Musculoskeletal: Positive for back pain. Skin: Negative for rash. Neurological: Negative for headaches, focal weakness or numbness. ____________________________________________  PHYSICAL EXAM:  VITAL SIGNS: ED Triage Vitals  Enc Vitals Group     BP 11/02/14 2005 146/98 mmHg     Pulse Rate 11/02/14 2005 95     Resp 11/02/14 2005 18     Temp 11/02/14 2005 98.6 F (37 C)     Temp Source 11/02/14 2005 Oral     SpO2 11/02/14 2005 95 %     Weight 11/02/14 2005 305 lb (138.347 kg)     Height 11/02/14 2005 6' (1.829 m)     Head Cir --      Peak Flow --      Pain Score 11/02/14 2005 10     Pain Loc --      Pain  Edu? --      Excl. in GC? --    Constitutional: Alert and oriented. Well appearing and in no distress. Eyes: Conjunctivae are normal. PERRL. Normal extraocular movements. ENT   Head: Normocephalic and atraumatic.   Nose: No congestion/rhinnorhea.   Mouth/Throat: Mucous membranes are moist.   Neck: Supple. No thyromegaly. Cardiovascular: Normal rate, regular rhythm.  Respiratory: Normal respiratory effort. No wheezes/rales/rhonchi. Gastrointestinal: Soft and nontender. No distention. Musculoskeletal: Nontender with normal range of motion in all extremities. Normal spinal alignment without deformity, spasm, step-off, or midline tenderness.  Normal lumbar flex/ext. Normal toe/heel raise.  Neurologic:  Normal gait without ataxia. Normal speech and language. No gross focal neurologic deficits are appreciated. CN [name]-XII grossly intact. Negative SLR bilaterally. Normal LE DTRs bilaterally. Normal toe dorsiflexion.  Skin:  Skin is warm, dry and intact. No rash noted. Psychiatric: Mood and affect are normal. Patient exhibits appropriate insight and judgment. ____________________________________________   RADIOLOGY CT Abd/Pelvis - 10/31/14 Results reviewed ____________________________________________  INITIAL IMPRESSION / ASSESSMENT AND PLAN / ED COURSE Discussed previous exam, CT, and normal exam today. Suggest patient follow-up with Dr. Lacie Scotts or Dr. Hyacinth Meeker for further care. Will write work note for restriction from driving activities this week. Prescription Ketorolac and Flexeril for inflammation and spasm, respectively.  ____________________________________________  FINAL CLINICAL IMPRESSION(S) / ED DIAGNOSES  Final diagnoses:  Lumbar back sprain, subsequent encounter     Lissa Hoard, PA-C 11/04/14 1225  Gayla Doss, MD 11/07/14 [phone]

## 2015-01-23 ENCOUNTER — Other Ambulatory Visit: Payer: Self-pay | Admitting: Family Medicine

## 2015-01-23 DIAGNOSIS — R7401 Elevation of levels of liver transaminase levels: Secondary | ICD-10-CM

## 2015-01-23 DIAGNOSIS — R740 Nonspecific elevation of levels of transaminase and lactic acid dehydrogenase [LDH]: Principal | ICD-10-CM

## 2015-02-08 ENCOUNTER — Ambulatory Visit
Admission: RE | Admit: 2015-02-08 | Discharge: 2015-02-08 | Disposition: A | Discharge status: Home / Self Care | Payer: BLUE CROSS/BLUE SHIELD | Source: Ambulatory Visit | Attending: Family Medicine | Admitting: Family Medicine

## 2015-02-08 DIAGNOSIS — R740 Nonspecific elevation of levels of transaminase and lactic acid dehydrogenase [LDH]: Secondary | ICD-10-CM | POA: Insufficient documentation

## 2015-02-08 DIAGNOSIS — R7401 Elevation of levels of liver transaminase levels: Secondary | ICD-10-CM

## 2015-05-11 ENCOUNTER — Emergency Department
Admission: EM | Admit: 2015-05-11 | Discharge: 2015-05-11 | Disposition: A | Discharge status: Home / Self Care | Payer: Self-pay | Attending: Emergency Medicine | Admitting: Emergency Medicine

## 2015-05-11 DIAGNOSIS — Z7952 Long term (current) use of systemic steroids: Secondary | ICD-10-CM | POA: Insufficient documentation

## 2015-05-11 DIAGNOSIS — F17210 Nicotine dependence, cigarettes, uncomplicated: Secondary | ICD-10-CM | POA: Insufficient documentation

## 2015-05-11 DIAGNOSIS — Z79899 Other long term (current) drug therapy: Secondary | ICD-10-CM | POA: Insufficient documentation

## 2015-05-11 DIAGNOSIS — M273 Alveolitis of jaws: Secondary | ICD-10-CM | POA: Insufficient documentation

## 2015-05-11 DIAGNOSIS — Z791 Long term (current) use of non-steroidal anti-inflammatories (NSAID): Secondary | ICD-10-CM | POA: Insufficient documentation

## 2015-05-11 DIAGNOSIS — K0889 Other specified disorders of teeth and supporting structures: Secondary | ICD-10-CM | POA: Insufficient documentation

## 2015-05-11 DIAGNOSIS — I10 Essential (primary) hypertension: Secondary | ICD-10-CM | POA: Insufficient documentation

## 2015-05-11 HISTORY — DX: Unspecified osteoarthritis, unspecified site: M19.90

## 2015-05-11 MED ORDER — AMOXICILLIN 500 MG PO TABS: 500.0000 mg | ORAL_TABLET | Freq: Three times a day (TID) | ORAL | Status: DC

## 2015-05-11 MED ORDER — AMOXICILLIN 500 MG PO CAPS
500.0000 mg | ORAL_CAPSULE | Freq: Once | ORAL | Status: AC
  Administered 2015-05-11: 500 mg via ORAL
  Filled 2015-05-11: qty 1

## 2015-05-11 MED ORDER — OXYCODONE-ACETAMINOPHEN 5-325 MG PO TABS: 1.0000 | ORAL_TABLET | Freq: Four times a day (QID) | ORAL | Status: DC | PRN

## 2015-05-11 MED ORDER — OXYCODONE-ACETAMINOPHEN 5-325 MG PO TABS
2.0000 | ORAL_TABLET | Freq: Once | ORAL | Status: AC
  Administered 2015-05-11: 2 via ORAL
  Filled 2015-05-11: qty 2

## 2015-05-11 MED ORDER — IBUPROFEN 800 MG PO TABS: 800.0000 mg | ORAL_TABLET | Freq: Three times a day (TID) | ORAL | Status: DC | PRN

## 2015-05-11 NOTE — Discharge Instructions (Signed)
Continue antibiotic and pain medicine as directed. Return to the emergency room if symptoms are worsening. Contact your dentist tomorrow for further evaluation.

## 2015-05-11 NOTE — ED Provider Notes (Signed)
[facility] Emergency Department Provider Note  ____________________________________________  Time seen: Approximately 9:41 PM  I have reviewed the triage vital signs and the nursing notes.   HISTORY  Chief Complaint Dental Pain    HPI Anthony [name] is a 35 y.o. male who was seen today by his dentist for tooth extraction. He did have pain prior to his visit. His left lower molar was removed. He developed worsening pain over the last 4-6 hours. Pain radiates to the left jaw and left temple. No fevers or chills. No nausea or vomiting. Pain with moving the jaw and opening the mouth.   Past Medical History  Diagnosis Date  . Hypertension   . High cholesterol   . Asthma   . Arthritis     There are no active problems to display for this patient.   Past Surgical History  Procedure Laterality Date  . Shoulder arthroscopy    . Cholecystectomy      Current Outpatient Rx  Name  Route  Sig  Dispense  Refill  . albuterol (PROVENTIL HFA;VENTOLIN HFA) 108 (90 BASE) MCG/ACT inhaler   Inhalation   Inhale 2 puffs into the lungs every 6 (six) hours as needed for wheezing or shortness of breath.   1 Inhaler   0   . amoxicillin (AMOXIL) 500 MG tablet   Oral   Take 1 tablet (500 mg total) by mouth 3 (three) times daily.   30 tablet   0   . brompheniramine-pseudoephedrine-DM 30-2-10 MG/5ML syrup   Oral   Take 5 mLs by mouth 4 (four) times daily as needed (cough and congestion).   100 mL   0   . cyclobenzaprine (FLEXERIL) 5 MG tablet   Oral   Take 1 tablet (5 mg total) by mouth 3 (three) times daily as needed for muscle spasms.   15 tablet   0   . hydrochlorothiazide (HYDRODIURIL) 25 MG tablet   Oral   Take 25 mg by mouth daily.         Marland Kitchen ibuprofen (ADVIL,MOTRIN) 800 MG tablet   Oral   Take 1 tablet (800 mg total) by mouth every 8 (eight) hours as needed.   15 tablet   0   . ketorolac (TORADOL) 10 MG tablet   Oral   Take 1 tablet (10  mg total) by mouth every 8 (eight) hours.   15 tablet   0   . lisinopril (PRINIVIL,ZESTRIL) 10 MG tablet   Oral   Take 10 mg by mouth daily.         . meloxicam (MOBIC) 7.5 MG tablet   Oral   Take 7.5 mg by mouth daily.         Marland Kitchen oxyCODONE-acetaminophen (ROXICET) 5-325 MG tablet   Oral   Take 1 tablet by mouth every 6 (six) hours as needed.   20 tablet   0   . pantoprazole (PROTONIX) 40 MG tablet   Oral   Take 40 mg by mouth daily.         . predniSONE (DELTASONE) 20 MG tablet   Oral   Take 1 tablet (20 mg total) by mouth daily with breakfast.   5 tablet   0   . traMADol (ULTRAM) 50 MG tablet   Oral   Take 1 tablet (50 mg total) by mouth every 6 (six) hours as needed.   20 tablet   0     Allergies Hydrocodone  No family history on file.  Social History Social History  Substance Use Topics  . Smoking status: Current Every Day Smoker -- 1.00 packs/day    Types: Cigarettes  . Smokeless tobacco: None  . Alcohol Use: No    Review of Systems Constitutional: No fever/chills Eyes: No visual changes. ENT: No sore throat. Cardiovascular: Denies chest pain. Respiratory: Denies shortness of breath. Gastrointestinal: No abdominal pain.  No nausea, no vomiting.  No diarrhea.  No constipation. Neurological: Negative for headaches, focal weakness or numbness. 10-point ROS otherwise negative.  ____________________________________________   PHYSICAL EXAM:  VITAL SIGNS: ED Triage Vitals  Enc Vitals Group     BP 05/11/15 2006 157/87 mmHg     Pulse Rate 05/11/15 2006 88     Resp 05/11/15 2006 20     Temp 05/11/15 2006 97.9 F (36.6 C)     Temp Source 05/11/15 2006 Oral     SpO2 05/11/15 2006 96 %     Weight 05/11/15 2006 310 lb (140.615 kg)     Height 05/11/15 2006 6' (1.829 m)     Head Cir --      Peak Flow --      Pain Score 05/11/15 2012 10     Pain Loc --      Pain Edu? --      Excl. in GC? --     Constitutional: Alert and oriented. Well  appearing and in no acute distress. Eyes: Conjunctivae are normal. PERRL. EOMI. Head: Atraumatic. Nose: No congestion/rhinnorhea. Mouth/Throat: Mucous membranes are moist.  Oropharynx non-erythematous. No lesions.  Dental socket noted on left lower jaw. Blood present. No signs of inflammation of the gums. Area is tender to touch. Neck:  Supple.  No adenopathy.   Cardiovascular: Normal rate, regular rhythm. Grossly normal heart sounds.  Good peripheral circulation. Respiratory: Normal respiratory effort.  No retractions. Lungs CTAB. Gastrointestinal: Soft and nontender. No distention. No abdominal bruits. No CVA tenderness. Musculoskeletal: Nml ROM of upper and lower extremity joints. Neurologic:  Normal speech and language. No gross focal neurologic deficits are appreciated. No gait instability. Skin:  Skin is warm, dry and intact. No rash noted. Psychiatric: Mood and affect are normal. Speech and behavior are normal.  ____________________________________________   LABS (all labs ordered are listed, but only abnormal results are displayed)  Labs Reviewed - No data to display ____________________________________________  EKG   ____________________________________________  RADIOLOGY   ____________________________________________   PROCEDURES  Procedure(s) performed: None  Critical Care performed: No  ____________________________________________   INITIAL IMPRESSION / ASSESSMENT AND PLAN / ED COURSE  Pertinent labs & imaging results that were available during my care of the patient were reviewed by me and considered in my medical decision making (see chart for details).  35 year old male who is status post tooth extraction earlier today. He has had increasing pain to the left jaw. He does report having pain prior to the tooth extraction. He has blood present in the socket, but gums appear normal without clear signs of infection. However was will still cover for possible  infectious process with amoxicillin 500. He is also given ibuprofen and Percocet. He can follow-up with his dentist tomorrow. ____________________________________________   FINAL CLINICAL IMPRESSION(S) / ED DIAGNOSES  Final diagnoses:  Pain, dental      Ignacia BayleyRobert Deakin Lacek, PA-C 05/11/15 2146  Minna AntisKevin Paduchowski, MD 05/11/15 2309

## 2015-05-11 NOTE — ED Notes (Signed)
Patient had lower left tooth extracted today. Presents tonight because he is in pain, cannot move his jaw to talk, thinks the site of extraction is infected. Shows a picture of the site and points to a white area. Clot noted in socket. Patient with facial swelling on left side. Dentist did not give pain medications, no antibiotics given. States they did numb him well before the extraction and is to return on Tuesday to see if the infection is gone.

## 2015-05-16 ENCOUNTER — Ambulatory Visit: Payer: BLUE CROSS/BLUE SHIELD

## 2015-05-23 ENCOUNTER — Ambulatory Visit: Payer: Self-pay

## 2015-06-14 ENCOUNTER — Ambulatory Visit: Payer: Self-pay

## 2015-06-14 ENCOUNTER — Encounter (INDEPENDENT_AMBULATORY_CARE_PROVIDER_SITE_OTHER): Payer: Self-pay

## 2015-10-08 ENCOUNTER — Encounter: Payer: Self-pay | Admitting: Emergency Medicine

## 2015-10-08 ENCOUNTER — Emergency Department: Payer: Self-pay

## 2015-10-08 ENCOUNTER — Emergency Department
Admission: EM | Admit: 2015-10-08 | Discharge: 2015-10-08 | Disposition: A | Discharge status: Home / Self Care | Payer: Self-pay | Attending: Emergency Medicine | Admitting: Emergency Medicine

## 2015-10-08 DIAGNOSIS — I10 Essential (primary) hypertension: Secondary | ICD-10-CM | POA: Insufficient documentation

## 2015-10-08 DIAGNOSIS — F17210 Nicotine dependence, cigarettes, uncomplicated: Secondary | ICD-10-CM | POA: Insufficient documentation

## 2015-10-08 DIAGNOSIS — J45909 Unspecified asthma, uncomplicated: Secondary | ICD-10-CM | POA: Insufficient documentation

## 2015-10-08 DIAGNOSIS — Z79899 Other long term (current) drug therapy: Secondary | ICD-10-CM | POA: Insufficient documentation

## 2015-10-08 DIAGNOSIS — K5732 Diverticulitis of large intestine without perforation or abscess without bleeding: Secondary | ICD-10-CM | POA: Insufficient documentation

## 2015-10-08 DIAGNOSIS — M1990 Unspecified osteoarthritis, unspecified site: Secondary | ICD-10-CM | POA: Insufficient documentation

## 2015-10-08 DIAGNOSIS — Z791 Long term (current) use of non-steroidal anti-inflammatories (NSAID): Secondary | ICD-10-CM | POA: Insufficient documentation

## 2015-10-08 LAB — CBC
HCT: 43.4 % (ref 40.0–52.0)
HEMOGLOBIN: 14.7 g/dL (ref 13.0–18.0)
MCH: 30.2 pg (ref 26.0–34.0)
MCHC: 33.9 g/dL (ref 32.0–36.0)
MCV: 88.9 fL (ref 80.0–100.0)
Platelets: 207 10*3/uL (ref 150–440)
RBC: 4.88 MIL/uL (ref 4.40–5.90)
RDW: 13 % (ref 11.5–14.5)
WBC: 12.4 10*3/uL — AB (ref 3.8–10.6)

## 2015-10-08 LAB — BASIC METABOLIC PANEL
ANION GAP: 8 (ref 5–15)
BUN: 15 mg/dL (ref 6–20)
CALCIUM: 8.7 mg/dL — AB (ref 8.9–10.3)
CO2: 22 mmol/L (ref 22–32)
Chloride: 109 mmol/L (ref 101–111)
Creatinine, Ser: 0.87 mg/dL (ref 0.61–1.24)
GLUCOSE: 129 mg/dL — AB (ref 65–99)
POTASSIUM: 4 mmol/L (ref 3.5–5.1)
SODIUM: 139 mmol/L (ref 135–145)

## 2015-10-08 LAB — URINALYSIS COMPLETE WITH MICROSCOPIC (ARMC ONLY)
BACTERIA UA: NONE SEEN
Bilirubin Urine: NEGATIVE
Glucose, UA: NEGATIVE mg/dL
Hgb urine dipstick: NEGATIVE
KETONES UR: NEGATIVE mg/dL
LEUKOCYTES UA: NEGATIVE
NITRITE: NEGATIVE
PROTEIN: NEGATIVE mg/dL
SPECIFIC GRAVITY, URINE: 1.025 (ref 1.005–1.030)
Squamous Epithelial / LPF: NONE SEEN
pH: 5 (ref 5.0–8.0)

## 2015-10-08 MED ORDER — METRONIDAZOLE 500 MG PO TABS
500.0000 mg | ORAL_TABLET | Freq: Once | ORAL | Status: AC
  Administered 2015-10-08: 500 mg via ORAL
  Filled 2015-10-08: qty 1

## 2015-10-08 MED ORDER — ONDANSETRON HCL 4 MG/2ML IJ SOLN
4.0000 mg | Freq: Once | INTRAMUSCULAR | Status: AC
  Administered 2015-10-08: 4 mg via INTRAVENOUS
  Filled 2015-10-08: qty 2

## 2015-10-08 MED ORDER — OXYCODONE-ACETAMINOPHEN 5-325 MG PO TABS
1.0000 | ORAL_TABLET | Freq: Once | ORAL | Status: AC
  Administered 2015-10-08: 1 via ORAL
  Filled 2015-10-08: qty 1

## 2015-10-08 MED ORDER — OXYCODONE-ACETAMINOPHEN 5-325 MG PO TABS: 1.0000 | ORAL_TABLET | ORAL | Status: DC | PRN

## 2015-10-08 MED ORDER — CIPROFLOXACIN HCL 500 MG PO TABS
500.0000 mg | ORAL_TABLET | Freq: Once | ORAL | Status: AC
  Administered 2015-10-08: 500 mg via ORAL
  Filled 2015-10-08: qty 1

## 2015-10-08 MED ORDER — SODIUM CHLORIDE 0.9 % IV BOLUS (SEPSIS)
1000.0000 mL | Freq: Once | INTRAVENOUS | Status: AC
  Administered 2015-10-08: 1000 mL via INTRAVENOUS

## 2015-10-08 MED ORDER — METRONIDAZOLE 500 MG PO TABS: 500.0000 mg | ORAL_TABLET | Freq: Two times a day (BID) | ORAL | Status: DC

## 2015-10-08 MED ORDER — DIATRIZOATE MEGLUMINE & SODIUM 66-10 % PO SOLN
15.0000 mL | Freq: Once | ORAL | Status: AC
  Administered 2015-10-08: 15 mL via ORAL

## 2015-10-08 MED ORDER — FENTANYL CITRATE (PF) 100 MCG/2ML IJ SOLN
INTRAMUSCULAR | Status: AC
  Administered 2015-10-08: 50 ug via INTRAVENOUS
  Filled 2015-10-08: qty 2

## 2015-10-08 MED ORDER — KETOROLAC TROMETHAMINE 30 MG/ML IJ SOLN
30.0000 mg | Freq: Once | INTRAMUSCULAR | Status: AC
  Administered 2015-10-08: 30 mg via INTRAVENOUS
  Filled 2015-10-08: qty 1

## 2015-10-08 MED ORDER — ONDANSETRON HCL 4 MG PO TABS: 4.0000 mg | ORAL_TABLET | Freq: Three times a day (TID) | ORAL | Status: DC | PRN

## 2015-10-08 MED ORDER — IOPAMIDOL (ISOVUE-300) INJECTION 61%
100.0000 mL | Freq: Once | INTRAVENOUS | Status: AC | PRN
  Administered 2015-10-08: 100 mL via INTRAVENOUS

## 2015-10-08 MED ORDER — HYDROMORPHONE HCL 1 MG/ML IJ SOLN
1.0000 mg | Freq: Once | INTRAMUSCULAR | Status: AC
  Administered 2015-10-08: 1 mg via INTRAVENOUS
  Filled 2015-10-08: qty 1

## 2015-10-08 MED ORDER — FENTANYL CITRATE (PF) 100 MCG/2ML IJ SOLN
50.0000 ug | INTRAMUSCULAR | Status: DC | PRN
  Administered 2015-10-08: 50 ug via INTRAVENOUS

## 2015-10-08 MED ORDER — MORPHINE SULFATE (PF) 4 MG/ML IV SOLN
4.0000 mg | Freq: Once | INTRAVENOUS | Status: AC
  Administered 2015-10-08: 4 mg via INTRAVENOUS
  Filled 2015-10-08: qty 1

## 2015-10-08 MED ORDER — CIPROFLOXACIN HCL 500 MG PO TABS: 500.0000 mg | ORAL_TABLET | Freq: Two times a day (BID) | ORAL | Status: DC

## 2015-10-08 NOTE — ED Notes (Signed)
Patient presents to the ED with lower abdominal pain radiating into penis and rectum.  Patient reports normal bm last night.  Patient reports pain with urination this morning.  Patient reports left flank pain x 2 weeks.  Patient speaking in full sentences, sitting still without obvious difficulty at this time.

## 2015-10-08 NOTE — Discharge Instructions (Signed)
You were evaluated for abdominal pain and found to have diverticulitis and are being treated with antibiotics Cipro and Flagyl. You are also been prescribed nausea medication called Zofran and pain medication called Roxicet.  Return to the emergency room worsening condition including black or bloody stools, worsening abdominal pain, dizziness or passing out, vomiting, or any other symptoms concerning to you.  Diverticulitis Diverticulitis is when small pockets that have formed in your colon (large intestine) become infected or swollen. HOME CARE  Follow your doctor's instructions.  Follow a special diet if told by your doctor.  When you feel better, your doctor may tell you to change your diet. You may be told to eat a lot of fiber. Fruits and vegetables are good sources of fiber. Fiber makes it easier to poop (have bowel movements).  Take supplements or probiotics as told by your doctor.  Only take medicines as told by your doctor.  Keep all follow-up visits with your doctor. GET HELP IF:  Your pain does not get better.  You have a hard time eating food.  You are not pooping like normal. GET HELP RIGHT AWAY IF:  Your pain gets worse.  Your problems do not get better.  Your problems suddenly get worse.  You have a fever.  You keep throwing up (vomiting).  You have bloody or black, tarry poop (stool). MAKE SURE YOU:   Understand these instructions.  Will watch your condition.  Will get help right away if you are not doing well or get worse.   This information is not intended to replace advice given to you by your health care provider. Make sure you discuss any questions you have with your health care provider.   Document Released: 10/02/2007 Document Revised: 04/20/2013 Document Reviewed: 03/10/2013 Elsevier Interactive Patient Education [employer]2016 [employer]

## 2015-10-08 NOTE — ED Notes (Signed)
Patient transported to CT 

## 2015-10-08 NOTE — ED Notes (Signed)
MD at bedside. 

## 2015-10-08 NOTE — ED Provider Notes (Signed)
[name] Emergency Department Provider Note   ____________________________________________  Time seen: Approximately 12pm I have reviewed the triage vital signs and the triage nursing note.  HISTORY  Chief Complaint Abdominal Pain   Historian Patient   HPI Anthony [name] is a 35 y.o. male woke up this morning with left flank pain which is severe sharp and intermittent and radiates into the left abdomen. When he tried to urinate he appeared only a small amount and he had pain into his penis and rectum. He has been having bilateral lower back pain which she thought was sore muscles for 2 weeks.  He's never been diagnosed at a physician's office with kidney stones before, but he states that he has actually passed one in the past. He states this pain is worse than that.  Denies bloody urine. Nausea without vomiting. No diarrhea. History of cholecystectomy. He sells his appendix. Pain over his bladder and left lower quadrant. Pain is moderate at this point about 5 out of 10. At worse it was 10 out of 10.    Past Medical History  Diagnosis Date  . Hypertension   . High cholesterol   . Asthma   . Arthritis     There are no active problems to display for this patient.   Past Surgical History  Procedure Laterality Date  . Shoulder arthroscopy    . Cholecystectomy      Current Outpatient Rx  Name  Route  Sig  Dispense  Refill  . albuterol (PROVENTIL HFA;VENTOLIN HFA) 108 (90 BASE) MCG/ACT inhaler   Inhalation   Inhale 2 puffs into the lungs every 6 (six) hours as needed for wheezing or shortness of breath.   1 Inhaler   0   . amoxicillin (AMOXIL) 500 MG tablet   Oral   Take 1 tablet (500 mg total) by mouth 3 (three) times daily.   30 tablet   0   . brompheniramine-pseudoephedrine-DM 30-2-10 MG/5ML syrup   Oral   Take 5 mLs by mouth 4 (four) times daily as needed (cough and congestion).   100 mL   0   . ciprofloxacin (CIPRO) 500 MG  tablet   Oral   Take 1 tablet (500 mg total) by mouth 2 (two) times daily.   20 tablet   0   . cyclobenzaprine (FLEXERIL) 5 MG tablet   Oral   Take 1 tablet (5 mg total) by mouth 3 (three) times daily as needed for muscle spasms.   15 tablet   0   . hydrochlorothiazide (HYDRODIURIL) 25 MG tablet   Oral   Take 25 mg by mouth daily.         Marland Kitchen. ibuprofen (ADVIL,MOTRIN) 800 MG tablet   Oral   Take 1 tablet (800 mg total) by mouth every 8 (eight) hours as needed.   15 tablet   0   . ketorolac (TORADOL) 10 MG tablet   Oral   Take 1 tablet (10 mg total) by mouth every 8 (eight) hours.   15 tablet   0   . lisinopril (PRINIVIL,ZESTRIL) 10 MG tablet   Oral   Take 10 mg by mouth daily.         . meloxicam (MOBIC) 7.5 MG tablet   Oral   Take 7.5 mg by mouth daily.         . metroNIDAZOLE (FLAGYL) 500 MG tablet   Oral   Take 1 tablet (500 mg total) by mouth 2 (two) times  daily.   20 tablet   0   . ondansetron (ZOFRAN) 4 MG tablet   Oral   Take 1 tablet (4 mg total) by mouth every 8 (eight) hours as needed for nausea or vomiting.   10 tablet   0   . oxyCODONE-acetaminophen (ROXICET) 5-325 MG tablet   Oral   Take 1 tablet by mouth every 4 (four) hours as needed for severe pain.   10 tablet   0   . pantoprazole (PROTONIX) 40 MG tablet   Oral   Take 40 mg by mouth daily.         . predniSONE (DELTASONE) 20 MG tablet   Oral   Take 1 tablet (20 mg total) by mouth daily with breakfast.   5 tablet   0   . traMADol (ULTRAM) 50 MG tablet   Oral   Take 1 tablet (50 mg total) by mouth every 6 (six) hours as needed.   20 tablet   0     Allergies Hydrocodone  No family history on file.  Social History Social History  Substance Use Topics  . Smoking status: Current Every Day Smoker -- 1.00 packs/day    Types: Cigarettes  . Smokeless tobacco: None  . Alcohol Use: No    Review of Systems  Constitutional: Negative for fever. Eyes: Negative for visual  changes. ENT: Negative for sore throat. Cardiovascular: Negative for chest pain. Respiratory: Negative for shortness of breath. Gastrointestinal: Negative for vomiting and diarrhea. Genitourinary: Frequency of urination. Musculoskeletal: Left flank pain. Skin: Negative for rash. Neurological: Negative for headache. 10 point Review of Systems otherwise negative ____________________________________________   PHYSICAL EXAM:  VITAL SIGNS: ED Triage Vitals  Enc Vitals Group     BP 10/08/15 1030 148/93 mmHg     Pulse Rate 10/08/15 1030 90     Resp 10/08/15 1030 18     Temp 10/08/15 1030 98.2 F (36.8 C)     Temp Source 10/08/15 1030 Oral     SpO2 10/08/15 1030 95 %     Weight 10/08/15 1030 310 lb (140.615 kg)     Height 10/08/15 1030 6' (1.829 m)     Head Cir --      Peak Flow --      Pain Score 10/08/15 1031 9     Pain Loc --      Pain Edu? --      Excl. in GC? --      Constitutional: Alert and oriented. Well appearingOverall looks like he is in some pain. HEENT   Head: Normocephalic and atraumatic.      Eyes: Conjunctivae are normal. PERRL. Normal extraocular movements.      Ears:         Nose: No congestion/rhinnorhea.   Mouth/Throat: Mucous membranes are moist.   Neck: No stridor. Cardiovascular/Chest: Normal rate, regular rhythm.  No murmurs, rubs, or gallops. Respiratory: Normal respiratory effort without tachypnea nor retractions. Breath sounds are clear and equal bilaterally. No wheezes/rales/rhonchi. Gastrointestinal: Soft. No distention, no guarding, no rebound. Moderate left side and left lower quadrant with left flank tenderness to palpation.  Genitourinary/rectal:Deferred Musculoskeletal: Nontender with normal range of motion in all extremities. No joint effusions.  No lower extremity tenderness.  No edema. Neurologic:  Normal speech and language. No gross or focal neurologic deficits are appreciated. Skin:  Skin is warm, dry and intact. No rash  noted. Psychiatric: Mood and affect are normal. Speech and behavior are normal. Patient exhibits appropriate insight and  judgment.  ____________________________________________   EKG I, Governor Rooks, MD, the attending physician have personally viewed and interpreted all ECGs.  None ____________________________________________  LABS (pertinent positives/negatives)  Labs Reviewed  URINALYSIS COMPLETEWITH MICROSCOPIC ([facility] ONLY) - Abnormal; Notable for the following:    Color, Urine YELLOW (*)    APPearance CLEAR (*)    All other components within normal limits  BASIC METABOLIC PANEL - Abnormal; Notable for the following:    Glucose, Bld 129 (*)    Calcium 8.7 (*)    All other components within normal limits  CBC - Abnormal; Notable for the following:    WBC 12.4 (*)    All other components within normal limits  URINE CULTURE    ____________________________________________  RADIOLOGY All Xrays were viewed by me. Imaging interpreted by Radiologist.  Kidney ultrasound:  IMPRESSION: 1. No hydronephrosis. 2. Possible 4 mm left renal collecting system calculus. No stone identified in this area on the 10/31/2014 CT.  CT abd pel:  IMPRESSION: 1. Sigmoid diverticulitis. __________________________________________  PROCEDURES  Procedure(s) performed: None  Critical Care performed: None  ____________________________________________   ED COURSE / ASSESSMENT AND PLAN  Pertinent labs & imaging results that were available during my care of the patient were reviewed by me and considered in my medical decision making (see chart for details).   Clinically this patient's symptoms sound like kidney stone. His urinalysis does not have a significant amount of red blood cells, and there is no evidence of urinary tract infection. I reviewed his prior imaging history and it appears she's had 3 CTs of his abdomen and pelvis over the past 3 years. I discussed with him that I would  prefer trying to not do a CT scan today if Possible.  History of some suprapubic discomfort and I will have the nurse check a bladder scan. I am to send for renal ultrasound.   Bladder scan only had 70 cc, no concern for urinary retention.  Kidney ultrasound negative for confirming kidney stone.  Multiple doses of pain medication, still in pain.  We discussed obtaining CT given persistent severe pain without diagnosis and chose to proceed.  CT c/w diverticulitis, pt ok for outpatient treatment.    CONSULTATIONS:   None   Patient / Family / Caregiver informed of clinical course, medical decision-making process, and agree with plan.   I discussed return precautions, follow-up instructions, and discharged instructions with patient and/or family.   ___________________________________________   FINAL CLINICAL IMPRESSION(S) / ED DIAGNOSES   Final diagnoses:  Diverticulitis of sigmoid colon              Note: This dictation was prepared with Dragon dictation. Any transcriptional errors that result from this process are unintentional   Governor Rooks, MD 10/11/15 0700

## 2015-10-09 ENCOUNTER — Encounter: Payer: Self-pay | Admitting: Emergency Medicine

## 2015-10-09 ENCOUNTER — Emergency Department
Admission: EM | Admit: 2015-10-09 | Discharge: 2015-10-09 | Disposition: A | Discharge status: Home / Self Care | Payer: Self-pay | Attending: Emergency Medicine | Admitting: Emergency Medicine

## 2015-10-09 DIAGNOSIS — Z7951 Long term (current) use of inhaled steroids: Secondary | ICD-10-CM | POA: Insufficient documentation

## 2015-10-09 DIAGNOSIS — K5732 Diverticulitis of large intestine without perforation or abscess without bleeding: Secondary | ICD-10-CM | POA: Insufficient documentation

## 2015-10-09 DIAGNOSIS — M1990 Unspecified osteoarthritis, unspecified site: Secondary | ICD-10-CM | POA: Insufficient documentation

## 2015-10-09 DIAGNOSIS — Z79899 Other long term (current) drug therapy: Secondary | ICD-10-CM | POA: Insufficient documentation

## 2015-10-09 DIAGNOSIS — J45909 Unspecified asthma, uncomplicated: Secondary | ICD-10-CM | POA: Insufficient documentation

## 2015-10-09 DIAGNOSIS — I10 Essential (primary) hypertension: Secondary | ICD-10-CM | POA: Insufficient documentation

## 2015-10-09 DIAGNOSIS — F17210 Nicotine dependence, cigarettes, uncomplicated: Secondary | ICD-10-CM | POA: Insufficient documentation

## 2015-10-09 LAB — COMPREHENSIVE METABOLIC PANEL
ALK PHOS: 99 U/L (ref 38–126)
ALT: 38 U/L (ref 17–63)
AST: 27 U/L (ref 15–41)
Albumin: 4 g/dL (ref 3.5–5.0)
Anion gap: 8 (ref 5–15)
BUN: 15 mg/dL (ref 6–20)
CALCIUM: 8.3 mg/dL — AB (ref 8.9–10.3)
CO2: 23 mmol/L (ref 22–32)
CREATININE: 0.98 mg/dL (ref 0.61–1.24)
Chloride: 103 mmol/L (ref 101–111)
Glucose, Bld: 108 mg/dL — ABNORMAL HIGH (ref 65–99)
Potassium: 3.6 mmol/L (ref 3.5–5.1)
Sodium: 134 mmol/L — ABNORMAL LOW (ref 135–145)
Total Bilirubin: 1.8 mg/dL — ABNORMAL HIGH (ref 0.3–1.2)
Total Protein: 6.6 g/dL (ref 6.5–8.1)

## 2015-10-09 LAB — CBC
HCT: 40.1 % (ref 40.0–52.0)
Hemoglobin: 13.9 g/dL (ref 13.0–18.0)
MCH: 30.9 pg (ref 26.0–34.0)
MCHC: 34.6 g/dL (ref 32.0–36.0)
MCV: 89.2 fL (ref 80.0–100.0)
PLATELETS: 169 10*3/uL (ref 150–440)
RBC: 4.49 MIL/uL (ref 4.40–5.90)
RDW: 12.9 % (ref 11.5–14.5)
WBC: 16.6 10*3/uL — AB (ref 3.8–10.6)

## 2015-10-09 LAB — URINALYSIS COMPLETE WITH MICROSCOPIC (ARMC ONLY)
BILIRUBIN URINE: NEGATIVE
Bacteria, UA: NONE SEEN
GLUCOSE, UA: NEGATIVE mg/dL
Hgb urine dipstick: NEGATIVE
Ketones, ur: NEGATIVE mg/dL
LEUKOCYTES UA: NEGATIVE
NITRITE: NEGATIVE
PH: 6 (ref 5.0–8.0)
Protein, ur: 30 mg/dL — AB
Specific Gravity, Urine: 1.03 (ref 1.005–1.030)

## 2015-10-09 LAB — LIPASE, BLOOD: LIPASE: 22 U/L (ref 11–51)

## 2015-10-09 MED ORDER — OXYCODONE-ACETAMINOPHEN 5-325 MG PO TABS
1.0000 | ORAL_TABLET | Freq: Once | ORAL | Status: AC
  Administered 2015-10-09: 1 via ORAL
  Filled 2015-10-09: qty 1

## 2015-10-09 MED ORDER — METRONIDAZOLE 500 MG PO TABS
500.0000 mg | ORAL_TABLET | Freq: Once | ORAL | Status: AC
  Administered 2015-10-09: 500 mg via ORAL
  Filled 2015-10-09: qty 1

## 2015-10-09 MED ORDER — CIPROFLOXACIN HCL 500 MG PO TABS
500.0000 mg | ORAL_TABLET | Freq: Once | ORAL | Status: AC
  Administered 2015-10-09: 500 mg via ORAL
  Filled 2015-10-09: qty 1

## 2015-10-09 MED ORDER — DICYCLOMINE HCL 10 MG/ML IM SOLN
20.0000 mg | Freq: Once | INTRAMUSCULAR | Status: AC
  Administered 2015-10-09: 20 mg via INTRAMUSCULAR
  Filled 2015-10-09: qty 2

## 2015-10-09 NOTE — ED Provider Notes (Signed)
[facility] Emergency Department Provider Note    ____________________________________________  Time seen: ~0500  I have reviewed the triage vital signs and the nursing notes.   HISTORY  Chief Complaint Abdominal Pain   History limited by: Not Limited   HPI Anthony [name] is a 35 y.o. male who presents to the emergency department today because of concerns for continued abdominal pain. He describes the pain as being located all over however worse in the lower abdomen. He does have some discomfort and pain with urination. The patient was seen yesterday in the emergency Department for the same symptoms. During that stay the patient had blood work and CT scan done. CT scan was consistent with diverticulitis. Patient states he was not able to fill his antibiotic or pains prescriptions after discharge. He denies any fevers.   Past Medical History  Diagnosis Date  . Hypertension   . High cholesterol   . Asthma   . Arthritis     There are no active problems to display for this patient.   Past Surgical History  Procedure Laterality Date  . Shoulder arthroscopy    . Cholecystectomy      Current Outpatient Rx  Name  Route  Sig  Dispense  Refill  . albuterol (PROVENTIL HFA;VENTOLIN HFA) 108 (90 BASE) MCG/ACT inhaler   Inhalation   Inhale 2 puffs into the lungs every 6 (six) hours as needed for wheezing or shortness of breath.   1 Inhaler   0   . amoxicillin (AMOXIL) 500 MG tablet   Oral   Take 1 tablet (500 mg total) by mouth 3 (three) times daily.   30 tablet   0   . brompheniramine-pseudoephedrine-DM 30-2-10 MG/5ML syrup   Oral   Take 5 mLs by mouth 4 (four) times daily as needed (cough and congestion).   100 mL   0   . ciprofloxacin (CIPRO) 500 MG tablet   Oral   Take 1 tablet (500 mg total) by mouth 2 (two) times daily.   20 tablet   0   . cyclobenzaprine (FLEXERIL) 5 MG tablet   Oral   Take 1 tablet (5 mg total) by mouth 3  (three) times daily as needed for muscle spasms.   15 tablet   0   . hydrochlorothiazide (HYDRODIURIL) 25 MG tablet   Oral   Take 25 mg by mouth daily.         Marland Kitchen. ibuprofen (ADVIL,MOTRIN) 800 MG tablet   Oral   Take 1 tablet (800 mg total) by mouth every 8 (eight) hours as needed.   15 tablet   0   . ketorolac (TORADOL) 10 MG tablet   Oral   Take 1 tablet (10 mg total) by mouth every 8 (eight) hours.   15 tablet   0   . lisinopril (PRINIVIL,ZESTRIL) 10 MG tablet   Oral   Take 10 mg by mouth daily.         . meloxicam (MOBIC) 7.5 MG tablet   Oral   Take 7.5 mg by mouth daily.         . metroNIDAZOLE (FLAGYL) 500 MG tablet   Oral   Take 1 tablet (500 mg total) by mouth 2 (two) times daily.   20 tablet   0   . ondansetron (ZOFRAN) 4 MG tablet   Oral   Take 1 tablet (4 mg total) by mouth every 8 (eight) hours as needed for nausea or vomiting.   10  tablet   0   . oxyCODONE-acetaminophen (ROXICET) 5-325 MG tablet   Oral   Take 1 tablet by mouth every 4 (four) hours as needed for severe pain.   10 tablet   0   . pantoprazole (PROTONIX) 40 MG tablet   Oral   Take 40 mg by mouth daily.         . predniSONE (DELTASONE) 20 MG tablet   Oral   Take 1 tablet (20 mg total) by mouth daily with breakfast.   5 tablet   0   . traMADol (ULTRAM) 50 MG tablet   Oral   Take 1 tablet (50 mg total) by mouth every 6 (six) hours as needed.   20 tablet   0     Allergies Hydrocodone  No family history on file.  Social History Social History  Substance Use Topics  . Smoking status: Current Every Day Smoker -- 1.00 packs/day    Types: Cigarettes  . Smokeless tobacco: None  . Alcohol Use: No    Review of Systems  Constitutional: Negative for fever. Cardiovascular: Negative for chest pain. Respiratory: Negative for shortness of breath. Gastrointestinal: Positive for abdominal pain Neurological: Negative for headaches, focal weakness or numbness.  10-point  ROS otherwise negative.  ____________________________________________   PHYSICAL EXAM:  VITAL SIGNS: ED Triage Vitals  Enc Vitals Group     BP 10/09/15 0337 125/61 mmHg     Pulse Rate 10/09/15 0337 111     Resp 10/09/15 0337 18     Temp 10/09/15 0337 98.2 F (36.8 C)     Temp Source 10/09/15 0337 Oral     SpO2 10/09/15 0337 95 %     Weight 10/09/15 0337 310 lb (140.615 kg)     Height 10/09/15 0337 6' (1.829 m)     Head Cir --      Peak Flow --      Pain Score 10/09/15 0338 10     Pain Loc --      Pain Edu? --      Excl. in GC? --      Constitutional: Alert and oriented. Appears uncomfortable.  Eyes: Conjunctivae are normal. PERRL. Normal extraocular movements. ENT   Head: Normocephalic and atraumatic.   Nose: No congestion/rhinnorhea.   Mouth/Throat: Mucous membranes are moist.   Neck: No stridor. Hematological/Lymphatic/Immunilogical: No cervical lymphadenopathy. Cardiovascular: Normal rate, regular rhythm.  No murmurs, rubs, or gallops. Respiratory: Normal respiratory effort without tachypnea nor retractions. Breath sounds are clear and equal bilaterally. No wheezes/rales/rhonchi. Gastrointestinal: Soft and somewhat diffusely tender. No rebound. No guarding.  Genitourinary: Deferred Musculoskeletal: Normal range of motion in all extremities. No joint effusions.  No lower extremity tenderness nor edema. Neurologic:  Normal speech and language. No gross focal neurologic deficits are appreciated.  Skin:  Skin is warm, dry and intact. No rash noted. Psychiatric: Mood and affect are normal. Speech and behavior are normal. Patient exhibits appropriate insight and judgment.  ____________________________________________    LABS (pertinent positives/negatives)  Labs Reviewed  COMPREHENSIVE METABOLIC PANEL - Abnormal; Notable for the following:    Sodium 134 (*)    Glucose, Bld 108 (*)    Calcium 8.3 (*)    Total Bilirubin 1.8 (*)    All other components  within normal limits  CBC - Abnormal; Notable for the following:    WBC 16.6 (*)    All other components within normal limits  URINALYSIS COMPLETEWITH MICROSCOPIC ([facility] ONLY) - Abnormal; Notable for the following:  Color, Urine AMBER (*)    APPearance CLEAR (*)    Protein, ur 30 (*)    Squamous Epithelial / LPF 0-5 (*)    All other components within normal limits  LIPASE, BLOOD     ____________________________________________   EKG  None  ____________________________________________    RADIOLOGY  None  ____________________________________________   PROCEDURES  Procedure(s) performed: None  Critical Care performed: No  ____________________________________________   INITIAL IMPRESSION / ASSESSMENT AND PLAN / ED COURSE  Pertinent labs & imaging results that were available during my care of the patient were reviewed by me and considered in my medical decision making (see chart for details).  Patient reviewed presents to the emergency department today with continued abdominal pain after being diagnosed with diverticulitis yesterday. Patient's leukocytosis is partly worse at this time. He did have a CT scan performed yesterday which did not show any perforation or abscess. My abdominal exam shows a soft abdomen. There is somewhat diffuse tenderness however there is no guarding or rebound. This point given my abdominal examination do not feel repeat imaging is necessary. I doubt the patient developed a abscess or perforation since his previous CT scan. Discussed with the patient importance of taking the antibiotics and pain medication. Discussed that likely the pain will improve once patient remains on antibiotics.  ____________________________________________   FINAL CLINICAL IMPRESSION(S) / ED DIAGNOSES  Final diagnoses:  Diverticulitis of large intestine without perforation or abscess without bleeding     Note: This dictation was prepared with Dragon dictation.  Any transcriptional errors that result from this process are unintentional    Phineas Semen, MD 10/09/15 0730

## 2015-10-09 NOTE — ED Notes (Signed)
Patient states that yesterday morning with difficulty urinating. Patient with complaint of sever pain to his lower abd down to his scrotum. Patient reports that he was seen here earlier today and was diagnosed with diverticulitis. Patient states that he was unable to get his prescriptions filled.

## 2015-10-09 NOTE — Discharge Instructions (Signed)
Please seek medical attention for any high fevers, chest pain, shortness of breath, change in behavior, persistent vomiting, bloody stool or any other new or concerning symptoms. ° ° °Diverticulitis °Diverticulitis is when small pockets that have formed in your colon (large intestine) become infected or swollen. °HOME CARE °· Follow your doctor's instructions. °· Follow a special diet if told by your doctor. °· When you feel better, your doctor may tell you to change your diet. You may be told to eat a lot of fiber. Fruits and vegetables are good sources of fiber. Fiber makes it easier to poop (have bowel movements). °· Take supplements or probiotics as told by your doctor. °· Only take medicines as told by your doctor. °· Keep all follow-up visits with your doctor. °GET HELP IF: °· Your pain does not get better. °· You have a hard time eating food. °· You are not pooping like normal. °GET HELP RIGHT AWAY IF: °· Your pain gets worse. °· Your problems do not get better. °· Your problems suddenly get worse. °· You have a fever. °· You keep throwing up (vomiting). °· You have bloody or black, tarry poop (stool). °MAKE SURE YOU:  °· Understand these instructions. °· Will watch your condition. °· Will get help right away if you are not doing well or get worse. °  °This information is not intended to replace advice given to you by your health care provider. Make sure you discuss any questions you have with your health care provider. °  °Document Released: 10/02/2007 Document Revised: 04/20/2013 Document Reviewed: 03/10/2013 °Elsevier Interactive Patient Education ©2016 [employer] ° °

## 2015-10-10 LAB — URINE CULTURE: Culture: NO GROWTH

## 2015-12-20 ENCOUNTER — Emergency Department
Admission: EM | Admit: 2015-12-20 | Discharge: 2015-12-20 | Disposition: A | Discharge status: Home / Self Care | Payer: Self-pay | Attending: Emergency Medicine | Admitting: Emergency Medicine

## 2015-12-20 ENCOUNTER — Encounter: Payer: Self-pay | Admitting: Medical Oncology

## 2015-12-20 DIAGNOSIS — R0981 Nasal congestion: Secondary | ICD-10-CM

## 2015-12-20 DIAGNOSIS — J40 Bronchitis, not specified as acute or chronic: Secondary | ICD-10-CM | POA: Insufficient documentation

## 2015-12-20 DIAGNOSIS — I10 Essential (primary) hypertension: Secondary | ICD-10-CM | POA: Insufficient documentation

## 2015-12-20 DIAGNOSIS — F17210 Nicotine dependence, cigarettes, uncomplicated: Secondary | ICD-10-CM | POA: Insufficient documentation

## 2015-12-20 MED ORDER — NAPROXEN 500 MG PO TABS: 500.0000 mg | ORAL_TABLET | Freq: Two times a day (BID) | ORAL | 0 refills | Status: DC

## 2015-12-20 MED ORDER — SULFAMETHOXAZOLE-TRIMETHOPRIM 800-160 MG PO TABS: 1.0000 | ORAL_TABLET | Freq: Two times a day (BID) | ORAL | 0 refills | Status: DC

## 2015-12-20 MED ORDER — PSEUDOEPH-BROMPHEN-DM 30-2-10 MG/5ML PO SYRP: 5.0000 mL | ORAL_SOLUTION | Freq: Four times a day (QID) | ORAL | 0 refills | Status: DC | PRN

## 2015-12-20 NOTE — ED Triage Notes (Signed)
Pt reports he began a few days ago with nasal congestion, yesterday began having cough and body aches.

## 2015-12-20 NOTE — ED Provider Notes (Signed)
[facility] Emergency Department Provider Note   ____________________________________________   None    (approximate)  I have reviewed the triage vital signs and the nursing notes.   HISTORY  Chief Complaint Nasal Congestion and Cough    HPI DJ SENTENO II is a 35 y.o. male patient complaining of 3 days of nasal congestion and ear pressure. Patient state yesterday he began to notice copious amount of postnasal drainage, productive cough and body aches. Patient also states he is having rib pain secondary to prolonged coughing. Patient denies fever but state he has had episodes of vomiting and diarrhea yesterday. He is rating his pain discomfort as 8/10. Patient describes his pain as "achy". No palliative measures taken for this complaint.   Past Medical History:  Diagnosis Date  . Arthritis   . Asthma   . High cholesterol   . Hypertension     There are no active problems to display for this patient.   Past Surgical History:  Procedure Laterality Date  . CHOLECYSTECTOMY    . SHOULDER ARTHROSCOPY      Prior to Admission medications   Medication Sig Start Date End Date Taking? Authorizing Provider  albuterol (PROVENTIL HFA;VENTOLIN HFA) 108 (90 BASE) MCG/ACT inhaler Inhale 2 puffs into the lungs every 6 (six) hours as needed for wheezing or shortness of breath. 08/31/14   Renford Dills, NP  amoxicillin (AMOXIL) 500 MG tablet Take 1 tablet (500 mg total) by mouth 3 (three) times daily. 05/11/15   Ignacia Bayley, PA-C  brompheniramine-pseudoephedrine-DM 30-2-10 MG/5ML syrup Take 5 mLs by mouth 4 (four) times daily as needed (cough and congestion). 08/31/14   Renford Dills, NP  brompheniramine-pseudoephedrine-DM 30-2-10 MG/5ML syrup Take 5 mLs by mouth 4 (four) times daily as needed. 12/20/15   Joni Reining, PA-C  ciprofloxacin (CIPRO) 500 MG tablet Take 1 tablet (500 mg total) by mouth 2 (two) times daily. 10/08/15   Governor Rooks, MD  cyclobenzaprine  (FLEXERIL) 5 MG tablet Take 1 tablet (5 mg total) by mouth 3 (three) times daily as needed for muscle spasms. 11/02/14   Jenise V Bacon Menshew, PA-C  hydrochlorothiazide (HYDRODIURIL) 25 MG tablet Take 25 mg by mouth daily.    Historical Provider, MD  ibuprofen (ADVIL,MOTRIN) 800 MG tablet Take 1 tablet (800 mg total) by mouth every 8 (eight) hours as needed. 05/11/15   Ignacia Bayley, PA-C  ketorolac (TORADOL) 10 MG tablet Take 1 tablet (10 mg total) by mouth every 8 (eight) hours. 11/02/14   Jenise V Bacon Menshew, PA-C  lisinopril (PRINIVIL,ZESTRIL) 10 MG tablet Take 10 mg by mouth daily.    Historical Provider, MD  meloxicam (MOBIC) 7.5 MG tablet Take 7.5 mg by mouth daily.    Historical Provider, MD  metroNIDAZOLE (FLAGYL) 500 MG tablet Take 1 tablet (500 mg total) by mouth 2 (two) times daily. 10/08/15   Governor Rooks, MD  naproxen (NAPROSYN) 500 MG tablet Take 1 tablet (500 mg total) by mouth 2 (two) times daily with a meal. 12/20/15   Joni Reining, PA-C  ondansetron (ZOFRAN) 4 MG tablet Take 1 tablet (4 mg total) by mouth every 8 (eight) hours as needed for nausea or vomiting. 10/08/15   Governor Rooks, MD  oxyCODONE-acetaminophen (ROXICET) 5-325 MG tablet Take 1 tablet by mouth every 4 (four) hours as needed for severe pain. 10/08/15   Governor Rooks, MD  pantoprazole (PROTONIX) 40 MG tablet Take 40 mg by mouth daily.    Historical Provider, MD  predniSONE (DELTASONE) 20 MG tablet Take 1 tablet (20 mg total) by mouth daily with breakfast. 08/31/14   Renford DillsLindsey Miller, NP  sulfamethoxazole-trimethoprim (BACTRIM DS,SEPTRA DS) 800-160 MG tablet Take 1 tablet by mouth 2 (two) times daily. 12/20/15   Joni Reiningonald K Keonta Alsip, PA-C    Allergies Hydrocodone  No family history on file.  Social History Social History  Substance Use Topics  . Smoking status: Current Every Day Smoker    Packs/day: 1.00    Types: Cigarettes  . Smokeless tobacco: Not on file  . Alcohol use No    Review of Systems Constitutional: No  fever/chills Eyes: No visual changes. ZOX:WRUEAENT:Nasal congestion, postnasal drainage, sore throat. Cardiovascular: Denies chest pain. Respiratory: Denies shortness of breath. Productive cough Gastrointestinal: No abdominal pain. Vomiting and diarrhea  Genitourinary: Negative for dysuria. Musculoskeletal: Chest wall pain secondary to coughing Skin: Negative for rash. Neurological: Negative for headaches, focal weakness or numbness. Endocrine:Hypertension hyperlipidemia Hematological/Lymphatic: Allergic/Immunilogical: Hydrocodone ____________________________________________   PHYSICAL EXAM:  VITAL SIGNS: ED Triage Vitals [12/20/15 1425]  Enc Vitals Group     BP (!) 141/102     Pulse Rate 95     Resp 20     Temp 98 F (36.7 C)     Temp Source Oral     SpO2 96 %     Weight 290 lb (131.5 kg)     Height 5\' 11"  (1.803 m)     Head Circumference      Peak Flow      Pain Score 8     Pain Loc      Pain Edu?      Excl. in GC?     Constitutional: Alert and oriented. Well appearing and in no acute distress. Eyes: Conjunctivae are normal. PERRL. EOMI. Head: Atraumatic. Nose: Edematous nasal turbinates with bilateral maxillary guarding Mouth/Throat: Mucous membranes are moist.  Oropharynx non-erythematous.Postnasal drainage Neck: No stridor.  No cervical spine tenderness to palpation. Hematological/Lymphatic/Immunilogical: No cervical lymphadenopathy. Cardiovascular: Normal rate, regular rhythm. Grossly normal heart sounds.  Good peripheral circulation. Respiratory: Normal respiratory effort.  No retractions. Lungs bilateral rhonchi breath sounds Gastrointestinal: Soft and nontender. No distention. No abdominal bruits. No CVA tenderness. Musculoskeletal: No lower extremity tenderness nor edema.  No joint effusions. Neurologic:  Normal speech and language. No gross focal neurologic deficits are appreciated. No gait instability. Skin:  Skin is warm, dry and intact. No rash  noted. Psychiatric: Mood and affect are normal. Speech and behavior are normal.  ____________________________________________   LABS (all labs ordered are listed, but only abnormal results are displayed)  Labs Reviewed - No data to display ____________________________________________  EKG   ____________________________________________  RADIOLOGY   ____________________________________________   PROCEDURES  Procedure(s) performed: None  Procedures  Critical Care performed: No  ____________________________________________   INITIAL IMPRESSION / ASSESSMENT AND PLAN / ED COURSE  Pertinent labs & imaging results that were available during my care of the patient were reviewed by me and considered in my medical decision making (see chart for details).  Sinus congestion and bronchitis. Patient given discharge care instructions. Patient get a prescription for Bromfed DM, naproxen, and Bactrim DS. Patient advised follow-up family doctor if condition persists over 5 days. Patient given a work note.  Clinical Course     ____________________________________________   FINAL CLINICAL IMPRESSION(S) / ED DIAGNOSES  Final diagnoses:  Congestion of nasal sinus  Bronchitis      NEW MEDICATIONS STARTED DURING THIS VISIT:  New Prescriptions   BROMPHENIRAMINE-PSEUDOEPHEDRINE-DM 30-2-10 MG/5ML SYRUP  Take 5 mLs by mouth 4 (four) times daily as needed.   NAPROXEN (NAPROSYN) 500 MG TABLET    Take 1 tablet (500 mg total) by mouth 2 (two) times daily with a meal.   SULFAMETHOXAZOLE-TRIMETHOPRIM (BACTRIM DS,SEPTRA DS) 800-160 MG TABLET    Take 1 tablet by mouth 2 (two) times daily.     Note:  This document was prepared using Dragon voice recognition software and may include unintentional dictation errors.    Joni ReiningRonald K Navy Belay, PA-C 12/20/15 1523    Loleta Roseory Forbach, MD 12/20/15 [phone]

## 2015-12-25 ENCOUNTER — Emergency Department: Payer: Self-pay

## 2015-12-25 ENCOUNTER — Encounter: Payer: Self-pay | Admitting: Emergency Medicine

## 2015-12-25 ENCOUNTER — Emergency Department
Admission: EM | Admit: 2015-12-25 | Discharge: 2015-12-25 | Disposition: A | Discharge status: Home / Self Care | Payer: Self-pay | Attending: Emergency Medicine | Admitting: Emergency Medicine

## 2015-12-25 DIAGNOSIS — Y939 Activity, unspecified: Secondary | ICD-10-CM | POA: Insufficient documentation

## 2015-12-25 DIAGNOSIS — M25562 Pain in left knee: Secondary | ICD-10-CM

## 2015-12-25 DIAGNOSIS — Y929 Unspecified place or not applicable: Secondary | ICD-10-CM | POA: Insufficient documentation

## 2015-12-25 DIAGNOSIS — M76899 Other specified enthesopathies of unspecified lower limb, excluding foot: Secondary | ICD-10-CM

## 2015-12-25 DIAGNOSIS — I10 Essential (primary) hypertension: Secondary | ICD-10-CM | POA: Insufficient documentation

## 2015-12-25 DIAGNOSIS — J45909 Unspecified asthma, uncomplicated: Secondary | ICD-10-CM | POA: Insufficient documentation

## 2015-12-25 DIAGNOSIS — Y999 Unspecified external cause status: Secondary | ICD-10-CM | POA: Insufficient documentation

## 2015-12-25 DIAGNOSIS — M7652 Patellar tendinitis, left knee: Secondary | ICD-10-CM | POA: Insufficient documentation

## 2015-12-25 DIAGNOSIS — W19XXXA Unspecified fall, initial encounter: Secondary | ICD-10-CM | POA: Insufficient documentation

## 2015-12-25 DIAGNOSIS — F17210 Nicotine dependence, cigarettes, uncomplicated: Secondary | ICD-10-CM | POA: Insufficient documentation

## 2015-12-25 MED ORDER — TRAMADOL HCL 50 MG PO TABS: 50.0000 mg | ORAL_TABLET | Freq: Four times a day (QID) | ORAL | 0 refills | Status: DC | PRN

## 2015-12-25 MED ORDER — NAPROXEN 500 MG PO TABS: 500.0000 mg | ORAL_TABLET | Freq: Two times a day (BID) | ORAL | Status: DC

## 2015-12-25 NOTE — Discharge Instructions (Signed)
Advised to purchase and wear elastic knee support while driving.

## 2015-12-25 NOTE — ED Triage Notes (Signed)
States he fell and hit knee about three weeks ago.  C/O swelling to knee and pain to leg.

## 2015-12-25 NOTE — ED Provider Notes (Signed)
[facility] Emergency Department Provider Note   ____________________________________________   None    (approximate)  I have reviewed the triage vital signs and the nursing notes.   HISTORY  Chief Complaint Knee Pain    HPI Anthony [name] is a 35 y.o. male patient complaining of left knee pain for 3 weeks. Patient state onset of pain status post fall. Patient stated pain is mostly in the popliteal area and the lateral aspect of his knee. Patient stated pain increases with flexion extension of the leg.Patient is rating the pain as a 10 over 10. Patient described the pain is intermittently "achy to sharp". No palliative measures taken for this complaint.  Past Medical History:  Diagnosis Date  . Arthritis   . Asthma   . High cholesterol   . Hypertension     There are no active problems to display for this patient.   Past Surgical History:  Procedure Laterality Date  . CHOLECYSTECTOMY    . SHOULDER ARTHROSCOPY      Prior to Admission medications   Medication Sig Start Date End Date Taking? Authorizing Provider  albuterol (PROVENTIL HFA;VENTOLIN HFA) 108 (90 BASE) MCG/ACT inhaler Inhale 2 puffs into the lungs every 6 (six) hours as needed for wheezing or shortness of breath. 08/31/14   Renford Dills, NP  amoxicillin (AMOXIL) 500 MG tablet Take 1 tablet (500 mg total) by mouth 3 (three) times daily. 05/11/15   Ignacia Bayley, PA-C  brompheniramine-pseudoephedrine-DM 30-2-10 MG/5ML syrup Take 5 mLs by mouth 4 (four) times daily as needed (cough and congestion). 08/31/14   Renford Dills, NP  brompheniramine-pseudoephedrine-DM 30-2-10 MG/5ML syrup Take 5 mLs by mouth 4 (four) times daily as needed. 12/20/15   Joni Reining, PA-C  ciprofloxacin (CIPRO) 500 MG tablet Take 1 tablet (500 mg total) by mouth 2 (two) times daily. 10/08/15   Governor Rooks, MD  cyclobenzaprine (FLEXERIL) 5 MG tablet Take 1 tablet (5 mg total) by mouth 3 (three) times daily as  needed for muscle spasms. 11/02/14   Jenise V Bacon Menshew, PA-C  hydrochlorothiazide (HYDRODIURIL) 25 MG tablet Take 25 mg by mouth daily.    Historical Provider, MD  ibuprofen (ADVIL,MOTRIN) 800 MG tablet Take 1 tablet (800 mg total) by mouth every 8 (eight) hours as needed. 05/11/15   Ignacia Bayley, PA-C  ketorolac (TORADOL) 10 MG tablet Take 1 tablet (10 mg total) by mouth every 8 (eight) hours. 11/02/14   Jenise V Bacon Menshew, PA-C  lisinopril (PRINIVIL,ZESTRIL) 10 MG tablet Take 10 mg by mouth daily.    Historical Provider, MD  meloxicam (MOBIC) 7.5 MG tablet Take 7.5 mg by mouth daily.    Historical Provider, MD  metroNIDAZOLE (FLAGYL) 500 MG tablet Take 1 tablet (500 mg total) by mouth 2 (two) times daily. 10/08/15   Governor Rooks, MD  naproxen (NAPROSYN) 500 MG tablet Take 1 tablet (500 mg total) by mouth 2 (two) times daily with a meal. 12/20/15   Joni Reining, PA-C  naproxen (NAPROSYN) 500 MG tablet Take 1 tablet (500 mg total) by mouth 2 (two) times daily with a meal. 12/25/15   Joni Reining, PA-C  ondansetron (ZOFRAN) 4 MG tablet Take 1 tablet (4 mg total) by mouth every 8 (eight) hours as needed for nausea or vomiting. 10/08/15   Governor Rooks, MD  oxyCODONE-acetaminophen (ROXICET) 5-325 MG tablet Take 1 tablet by mouth every 4 (four) hours as needed for severe pain. 10/08/15   Governor Rooks, MD  pantoprazole (PROTONIX) 40 MG tablet Take 40 mg by mouth daily.    Historical Provider, MD  predniSONE (DELTASONE) 20 MG tablet Take 1 tablet (20 mg total) by mouth daily with breakfast. 08/31/14   Renford DillsLindsey Miller, NP  sulfamethoxazole-trimethoprim (BACTRIM DS,SEPTRA DS) 800-160 MG tablet Take 1 tablet by mouth 2 (two) times daily. 12/20/15   Joni Reiningonald K Smith, PA-C  traMADol (ULTRAM) 50 MG tablet Take 1 tablet (50 mg total) by mouth every 6 (six) hours as needed. 12/25/15 12/24/16  Joni Reiningonald K Smith, PA-C    Allergies Hydrocodone  No family history on file.  Social History Social History  Substance Use  Topics  . Smoking status: Current Every Day Smoker    Packs/day: 1.00    Types: Cigarettes  . Smokeless tobacco: Never Used  . Alcohol use No    Review of Systems Constitutional: No fever/chills Eyes: No visual changes. ENT: No sore throat. Cardiovascular: Denies chest pain. Respiratory: Denies shortness of breath. Gastrointestinal: No abdominal pain.  No nausea, no vomiting.  No diarrhea.  No constipation. Genitourinary: Negative for dysuria. Musculoskeletal: Knee pain  Skin: Negative for rash. Neurological: Negative for headaches, focal weakness or numbness. Endocrine:Hypertension hyperlipidemia ____________________________________________   PHYSICAL EXAM:  VITAL SIGNS: ED Triage Vitals  Enc Vitals Group     BP 12/25/15 1715 (!) 141/76     Pulse Rate 12/25/15 1715 92     Resp 12/25/15 1715 20     Temp 12/25/15 1715 98.1 F (36.7 C)     Temp Source 12/25/15 1715 Oral     SpO2 12/25/15 1715 95 %     Weight 12/25/15 1716 295 lb (133.8 kg)     Height 12/25/15 1716 5\' 11"  (1.803 m)     Head Circumference --      Peak Flow --      Pain Score 12/25/15 1717 10     Pain Loc --      Pain Edu? --      Excl. in GC? --     Constitutional: Alert and oriented. Well appearing and in no acute distress. Eyes: Conjunctivae are normal. PERRL. EOMI. Head: Atraumatic. Nose: No congestion/rhinnorhea. Mouth/Throat: Mucous membranes are moist.  Oropharynx non-erythematous. Neck: No stridor.  No cervical spine tenderness to palpation. Hematological/Lymphatic/Immunilogical: No cervical lymphadenopathy. Cardiovascular: Normal rate, regular rhythm. Grossly normal heart sounds.  Good peripheral circulation. Respiratory: Normal respiratory effort.  No retractions. Lungs CTAB. Gastrointestinal: Soft and nontender. No distention. No abdominal bruits. No CVA tenderness. Musculoskeletal: No obvious deformity or edema. No crepitus palpation. Patient has some moderate guarding with palpation of  palpable tender area.  Neurologic:  Normal speech and language. No gross focal neurologic deficits are appreciated. No gait instability. Skin:  Skin is warm, dry and intact. No rash noted. Psychiatric: Mood and affect are normal. Speech and behavior are normal.  ____________________________________________   LABS (all labs ordered are listed, but only abnormal results are displayed)  Labs Reviewed - No data to display ____________________________________________  EKG   ____________________________________________  RADIOLOGY  No acute findings on x-ray. ____________________________________________   PROCEDURES  Procedure(s) performed: None  Procedures  Critical Care performed: No  ____________________________________________   INITIAL IMPRESSION / ASSESSMENT AND PLAN / ED COURSE  Pertinent labs & imaging results that were available during my care of the patient were reviewed by me and considered in my medical decision making (see chart for details).  Tendinitis left knee. Discussed x-ray finding with patient. Patient given discharge care instructions. Patient given a  prescription for naproxen and Percocets. Patient advised to follow orthopedics if no improvement in 2 weeks.  Clinical Course     ____________________________________________   FINAL CLINICAL IMPRESSION(S) / ED DIAGNOSES  Final diagnoses:  Knee pain, acute, left  Tendinitis of knee      NEW MEDICATIONS STARTED DURING THIS VISIT:  New Prescriptions   NAPROXEN (NAPROSYN) 500 MG TABLET    Take 1 tablet (500 mg total) by mouth 2 (two) times daily with a meal.   TRAMADOL (ULTRAM) 50 MG TABLET    Take 1 tablet (50 mg total) by mouth every 6 (six) hours as needed.     Note:  This document was prepared using Dragon voice recognition software and may include unintentional dictation errors.    Joni Reining, PA-C 12/25/15 1837    Phineas Semen, MD 12/25/15 2010

## 2015-12-25 NOTE — ED Notes (Signed)
See triage note. States he fell about 3 weeks ago  conts to have pain to left knee. But today developed pain post knee and into calf

## 2016-05-13 ENCOUNTER — Encounter: Payer: Self-pay | Admitting: *Deleted

## 2016-05-13 ENCOUNTER — Emergency Department
Admission: EM | Admit: 2016-05-13 | Discharge: 2016-05-13 | Disposition: A | Discharge status: Home / Self Care | Payer: Self-pay | Attending: Emergency Medicine | Admitting: Emergency Medicine

## 2016-05-13 DIAGNOSIS — Z79899 Other long term (current) drug therapy: Secondary | ICD-10-CM | POA: Insufficient documentation

## 2016-05-13 DIAGNOSIS — I10 Essential (primary) hypertension: Secondary | ICD-10-CM | POA: Insufficient documentation

## 2016-05-13 DIAGNOSIS — J101 Influenza due to other identified influenza virus with other respiratory manifestations: Secondary | ICD-10-CM

## 2016-05-13 DIAGNOSIS — Z791 Long term (current) use of non-steroidal anti-inflammatories (NSAID): Secondary | ICD-10-CM | POA: Insufficient documentation

## 2016-05-13 DIAGNOSIS — J09X2 Influenza due to identified novel influenza A virus with other respiratory manifestations: Secondary | ICD-10-CM | POA: Insufficient documentation

## 2016-05-13 DIAGNOSIS — J45909 Unspecified asthma, uncomplicated: Secondary | ICD-10-CM | POA: Insufficient documentation

## 2016-05-13 DIAGNOSIS — F17210 Nicotine dependence, cigarettes, uncomplicated: Secondary | ICD-10-CM | POA: Insufficient documentation

## 2016-05-13 LAB — INFLUENZA PANEL BY PCR (TYPE A & B)
INFLBPCR: NEGATIVE
Influenza A By PCR: POSITIVE — AB

## 2016-05-13 MED ORDER — OSELTAMIVIR PHOSPHATE 75 MG PO CAPS: 75.0000 mg | ORAL_CAPSULE | Freq: Two times a day (BID) | ORAL | 0 refills | Status: AC

## 2016-05-13 MED ORDER — IBUPROFEN 800 MG PO TABS: 800.0000 mg | ORAL_TABLET | Freq: Three times a day (TID) | ORAL | 0 refills | Status: DC | PRN

## 2016-05-13 MED ORDER — PSEUDOEPH-BROMPHEN-DM 30-2-10 MG/5ML PO SYRP: 5.0000 mL | ORAL_SOLUTION | Freq: Four times a day (QID) | ORAL | 0 refills | Status: DC | PRN

## 2016-05-13 MED ORDER — ONDANSETRON 4 MG PO TBDP
4.0000 mg | ORAL_TABLET | Freq: Once | ORAL | Status: AC
  Administered 2016-05-13: 4 mg via ORAL
  Filled 2016-05-13: qty 1

## 2016-05-13 MED ORDER — KETOROLAC TROMETHAMINE 60 MG/2ML IM SOLN
60.0000 mg | Freq: Once | INTRAMUSCULAR | Status: AC
  Administered 2016-05-13: 60 mg via INTRAMUSCULAR
  Filled 2016-05-13: qty 2

## 2016-05-13 NOTE — ED Triage Notes (Signed)
States headache, fever, bodyaches, cough for 2 days, awake and alert upon arrival

## 2016-05-13 NOTE — ED Provider Notes (Signed)
[facility] Emergency Department Provider Note   ____________________________________________   First MD Initiated Contact with Patient 05/13/16 [phone]     (approximate)  I have reviewed the triage vital signs and the nursing notes.   HISTORY  Chief Complaint Fever; Headache; and Generalized Body Aches    HPI Anthony [name] is a 36 y.o. male patient complaining of headache, bodyaches, fever, and cough which started yesterday. Patient stated there is nausea but no vomiting or diarrhea. Patient has not taken a flu shot this season. Patient rates his pain discomfort as6/10. Patient describes pain as "achy". No palliative measures taken for these complaints.   Past Medical History:  Diagnosis Date  . Arthritis   . Asthma   . High cholesterol   . Hypertension     There are no active problems to display for this patient.   Past Surgical History:  Procedure Laterality Date  . CHOLECYSTECTOMY    . SHOULDER ARTHROSCOPY      Prior to Admission medications   Medication Sig Start Date End Date Taking? Authorizing Provider  albuterol (PROVENTIL HFA;VENTOLIN HFA) 108 (90 BASE) MCG/ACT inhaler Inhale 2 puffs into the lungs every 6 (six) hours as needed for wheezing or shortness of breath. 08/31/14   Renford Dills, NP  amoxicillin (AMOXIL) 500 MG tablet Take 1 tablet (500 mg total) by mouth 3 (three) times daily. 05/11/15   Ignacia Bayley, PA-C  brompheniramine-pseudoephedrine-DM 30-2-10 MG/5ML syrup Take 5 mLs by mouth 4 (four) times daily as needed (cough and congestion). 08/31/14   Renford Dills, NP  brompheniramine-pseudoephedrine-DM 30-2-10 MG/5ML syrup Take 5 mLs by mouth 4 (four) times daily as needed. 12/20/15   Joni Reining, PA-C  brompheniramine-pseudoephedrine-DM 30-2-10 MG/5ML syrup Take 5 mLs by mouth 4 (four) times daily as needed. 05/13/16   Joni Reining, PA-C  ciprofloxacin (CIPRO) 500 MG tablet Take 1 tablet (500 mg total) by mouth 2 (two)  times daily. 10/08/15   Governor Rooks, MD  cyclobenzaprine (FLEXERIL) 5 MG tablet Take 1 tablet (5 mg total) by mouth 3 (three) times daily as needed for muscle spasms. 11/02/14   Jenise V Bacon Menshew, PA-C  hydrochlorothiazide (HYDRODIURIL) 25 MG tablet Take 25 mg by mouth daily.    Historical Provider, MD  ibuprofen (ADVIL,MOTRIN) 800 MG tablet Take 1 tablet (800 mg total) by mouth every 8 (eight) hours as needed. 05/11/15   Ignacia Bayley, PA-C  ibuprofen (ADVIL,MOTRIN) 800 MG tablet Take 1 tablet (800 mg total) by mouth every 8 (eight) hours as needed. 05/13/16   Joni Reining, PA-C  ketorolac (TORADOL) 10 MG tablet Take 1 tablet (10 mg total) by mouth every 8 (eight) hours. 11/02/14   Jenise V Bacon Menshew, PA-C  lisinopril (PRINIVIL,ZESTRIL) 10 MG tablet Take 10 mg by mouth daily.    Historical Provider, MD  meloxicam (MOBIC) 7.5 MG tablet Take 7.5 mg by mouth daily.    Historical Provider, MD  metroNIDAZOLE (FLAGYL) 500 MG tablet Take 1 tablet (500 mg total) by mouth 2 (two) times daily. 10/08/15   Governor Rooks, MD  naproxen (NAPROSYN) 500 MG tablet Take 1 tablet (500 mg total) by mouth 2 (two) times daily with a meal. 12/20/15   Joni Reining, PA-C  naproxen (NAPROSYN) 500 MG tablet Take 1 tablet (500 mg total) by mouth 2 (two) times daily with a meal. 12/25/15   Joni Reining, PA-C  ondansetron (ZOFRAN) 4 MG tablet Take 1 tablet (4 mg total) by mouth  every 8 (eight) hours as needed for nausea or vomiting. 10/08/15   Governor Rooks, MD  oseltamivir (TAMIFLU) 75 MG capsule Take 1 capsule (75 mg total) by mouth 2 (two) times daily. 05/13/16 05/18/16  Joni Reining, PA-C  oxyCODONE-acetaminophen (ROXICET) 5-325 MG tablet Take 1 tablet by mouth every 4 (four) hours as needed for severe pain. 10/08/15   Governor Rooks, MD  pantoprazole (PROTONIX) 40 MG tablet Take 40 mg by mouth daily.    Historical Provider, MD  predniSONE (DELTASONE) 20 MG tablet Take 1 tablet (20 mg total) by mouth daily with breakfast.  08/31/14   Renford Dills, NP  sulfamethoxazole-trimethoprim (BACTRIM DS,SEPTRA DS) 800-160 MG tablet Take 1 tablet by mouth 2 (two) times daily. 12/20/15   Joni Reining, PA-C  traMADol (ULTRAM) 50 MG tablet Take 1 tablet (50 mg total) by mouth every 6 (six) hours as needed. 12/25/15 12/24/16  Joni Reining, PA-C    Allergies Hydrocodone  History reviewed. No pertinent family history.  Social History Social History  Substance Use Topics  . Smoking status: Current Every Day Smoker    Packs/day: 1.00    Types: Cigarettes  . Smokeless tobacco: Never Used  . Alcohol use No    Review of Systems Constitutional: Fever, chills, and body aches. Eyes: No visual changes. ENT: No sore throat. Cardiovascular: Denies chest pain. Respiratory: Denies shortness of breath. Gastrointestinal: No abdominal pain.  Nausea without vomiting.  No diarrhea.  No constipation. Genitourinary: Negative for dysuria. Musculoskeletal: Negative for back pain. Skin: Negative for rash. Neurological: Positive for frontal headache headaches, but denies focal weakness or numbness. Endocrine: Hypertension hyperlipidemia   ____________________________________________   PHYSICAL EXAM:  VITAL SIGNS: ED Triage Vitals [05/13/16 0838]  Enc Vitals Group     BP (!) 153/99     Pulse Rate 97     Resp 18     Temp 100 F (37.8 C)     Temp Source Oral     SpO2 96 %     Weight 285 lb (129.3 kg)     Height 6' (1.829 m)     Head Circumference      Peak Flow      Pain Score 6     Pain Loc      Pain Edu?      Excl. in GC?     Constitutional: Alert and oriented. Well appearing and in no acute distress.Afebrile Eyes: Conjunctivae are normal. PERRL. EOMI. Head: Atraumatic. Nose: No congestion/rhinnorhea. Mouth/Throat: Mucous membranes are moist.  Oropharynx non-erythematous. Neck: No stridor.  No cervical spine tenderness to palpation. Hematological/Lymphatic/Immunilogical: No cervical  lymphadenopathy. Cardiovascular: Normal rate, regular rhythm. Grossly normal heart sounds.  Good peripheral circulation. Elevated blood pressure Respiratory: Normal respiratory effort.  No retractions. Lungs CTAB. Gastrointestinal: Soft and nontender. No distention. No abdominal bruits. No CVA tenderness. Musculoskeletal: No lower extremity tenderness nor edema.  No joint effusions. Neurologic:  Normal speech and language. No gross focal neurologic deficits are appreciated. No gait instability. Skin:  Skin is warm, dry and intact. No rash noted. Psychiatric: Mood and affect are normal. Speech and behavior are normal.  ____________________________________________   LABS (all labs ordered are listed, but only abnormal results are displayed)  Labs Reviewed  INFLUENZA PANEL BY PCR (TYPE A & B, H1N1) - Abnormal; Notable for the following:       Result Value   Influenza A By PCR POSITIVE (*)    All other components within normal limits   ____________________________________________  EKG   ____________________________________________  RADIOLOGY   ____________________________________________   PROCEDURES  Procedure(s) performed: None  Procedures  Critical Care performed: No  ____________________________________________   INITIAL IMPRESSION / ASSESSMENT AND PLAN / ED COURSE  Pertinent labs & imaging results that were available during my care of the patient were reviewed by me and considered in my medical decision making (see chart for details). Influenza "A". Patient given discharge care instructions. Patient given a prescription for Tamiflu, but felt DM, and ibuprofen. Patient given a work no. Patient [facility] no improvement in 3-5 days. Clinical Course      ____________________________________________   FINAL CLINICAL IMPRESSION(S) / ED DIAGNOSES  Final diagnoses:  Influenza A      NEW MEDICATIONS STARTED DURING THIS  VISIT:  New Prescriptions   BROMPHENIRAMINE-PSEUDOEPHEDRINE-DM 30-2-10 MG/5ML SYRUP    Take 5 mLs by mouth 4 (four) times daily as needed.   IBUPROFEN (ADVIL,MOTRIN) 800 MG TABLET    Take 1 tablet (800 mg total) by mouth every 8 (eight) hours as needed.   OSELTAMIVIR (TAMIFLU) 75 MG CAPSULE    Take 1 capsule (75 mg total) by mouth 2 (two) times daily.     Note:  This document was prepared using Dragon voice recognition software and may include unintentional dictation errors.    Joni Reiningonald K Smith, PA-C 05/13/16 [id number]    Jeanmarie PlantJames A McShane, MD 05/13/16 [phone]

## 2016-05-13 NOTE — ED Notes (Signed)
See triage note  States he developed fever and body aches with cough yesterday  Low grade fever on arrival

## 2016-07-15 ENCOUNTER — Emergency Department: Payer: Self-pay

## 2016-07-15 ENCOUNTER — Emergency Department
Admission: EM | Admit: 2016-07-15 | Discharge: 2016-07-15 | Disposition: A | Discharge status: Home / Self Care | Payer: Self-pay | Attending: Emergency Medicine | Admitting: Emergency Medicine

## 2016-07-15 ENCOUNTER — Encounter: Payer: Self-pay | Admitting: *Deleted

## 2016-07-15 DIAGNOSIS — I10 Essential (primary) hypertension: Secondary | ICD-10-CM | POA: Insufficient documentation

## 2016-07-15 DIAGNOSIS — J45909 Unspecified asthma, uncomplicated: Secondary | ICD-10-CM | POA: Insufficient documentation

## 2016-07-15 DIAGNOSIS — M79671 Pain in right foot: Secondary | ICD-10-CM | POA: Insufficient documentation

## 2016-07-15 DIAGNOSIS — F17210 Nicotine dependence, cigarettes, uncomplicated: Secondary | ICD-10-CM | POA: Insufficient documentation

## 2016-07-15 LAB — URINALYSIS, COMPLETE (UACMP) WITH MICROSCOPIC
BILIRUBIN URINE: NEGATIVE
Bacteria, UA: NONE SEEN
Glucose, UA: NEGATIVE mg/dL
Hgb urine dipstick: NEGATIVE
KETONES UR: NEGATIVE mg/dL
Leukocytes, UA: NEGATIVE
Nitrite: NEGATIVE
PH: 5 (ref 5.0–8.0)
PROTEIN: NEGATIVE mg/dL
RBC / HPF: NONE SEEN RBC/hpf (ref 0–5)
SQUAMOUS EPITHELIAL / LPF: NONE SEEN
Specific Gravity, Urine: 1.021 (ref 1.005–1.030)

## 2016-07-15 MED ORDER — KETOROLAC TROMETHAMINE 30 MG/ML IJ SOLN
30.0000 mg | Freq: Once | INTRAMUSCULAR | Status: AC
  Administered 2016-07-15: 30 mg via INTRAMUSCULAR
  Filled 2016-07-15: qty 1

## 2016-07-15 MED ORDER — MELOXICAM 15 MG PO TABS: 15.0000 mg | ORAL_TABLET | Freq: Every day | ORAL | 0 refills | Status: AC

## 2016-07-15 NOTE — ED Notes (Signed)
See triage note. States he developed pain to right 5 th toe for 2-3 weeks  States pain was intermittent for about 2 weeks   Became worse over past few days  Denies any injury

## 2016-07-15 NOTE — ED Triage Notes (Signed)
States right foot pinky toe pain, for 2-3 weeks, Denis any injury, ambulatory

## 2016-07-15 NOTE — Discharge Instructions (Signed)
[facility] and also to have your blood pressure rechecked. Take meloxicam once daily with food for your foot pain and inflammation. Soak foot in absence saltwater tonight.  Do not make this water hot.   Also have them checked your urine again at the office. Your urine today was without any infection or blood to indicate urinary tract infection or kidney stone.

## 2016-07-15 NOTE — ED Provider Notes (Signed)
[facility] Emergency Department Provider Note  ____________________________________________   First MD Initiated Contact with Patient 07/15/16 1611     (approximate)  I have reviewed the triage vital signs and the nursing notes.   HISTORY  Chief Complaint Foot Pain    HPI Anthony [name] is a 36 y.o. male is here with complaint of right fifth toe pain for 2-3 weeks. She denies any injury to his foot. He denies any previous history of gout. There is been no evidence of athlete's foot . Patient denies any drainage from the area. He is concerned that he also may have a kidney stone or calcium in his urine and causing his foot hurt. He rates his pain is 7 out of 10. He has not taken any over-the-counter medication for his pain as he thought that it would get better without taking anything. It is increased with walking and very little improvement is made with rest.   Past Medical History:  Diagnosis Date  . Arthritis   . Asthma   . High cholesterol   . Hypertension     There are no active problems to display for this patient.   Past Surgical History:  Procedure Laterality Date  . CHOLECYSTECTOMY    . SHOULDER ARTHROSCOPY      Prior to Admission medications   Medication Sig Start Date End Date Taking? Authorizing Provider  meloxicam (MOBIC) 15 MG tablet Take 1 tablet (15 mg total) by mouth daily. 07/15/16 07/15/17  Tommi Rumps, PA-C    Allergies Hydrocodone  History reviewed. No pertinent family history.  Social History Social History  Substance Use Topics  . Smoking status: Current Every Day Smoker    Packs/day: 1.00    Types: Cigarettes  . Smokeless tobacco: Never Used  . Alcohol use No    Review of Systems Constitutional: No fever/chills Cardiovascular: Denies chest pain. Respiratory: Denies shortness of breath. Gastrointestinal: No abdominal pain.  No nausea, no vomiting.   Genitourinary: Negative for  dysuria. Musculoskeletal: Positive for right fifth toe pain. Skin: Negative for rash. Neurological: Negative for headaches, focal weakness or numbness.  10-point ROS otherwise negative.  ____________________________________________   PHYSICAL EXAM:  VITAL SIGNS: ED Triage Vitals  Enc Vitals Group     BP 07/15/16 1552 (!) 163/102     Pulse Rate 07/15/16 1552 90     Resp 07/15/16 1552 18     Temp 07/15/16 1552 97.7 F (36.5 C)     Temp Source 07/15/16 1552 Oral     SpO2 07/15/16 1552 96 %     Weight 07/15/16 1553 290 lb (131.5 kg)     Height 07/15/16 1553 6' (1.829 m)     Head Circumference --      Peak Flow --      Pain Score 07/15/16 1553 7     Pain Loc --      Pain Edu? --      Excl. in GC? --     Constitutional: Alert and oriented. Well appearing and in no acute distress. Eyes: Conjunctivae are normal. PERRL. EOMI. Head: Atraumatic. Nose: No congestion/rhinnorhea. Neck: No stridor.   Cardiovascular: Normal rate, regular rhythm. Grossly normal heart sounds.  Good peripheral circulation. Respiratory: Normal respiratory effort.  No retractions. Lungs CTAB. Gastrointestinal: Soft and nontender. No distention. No CVA tenderness. Musculoskeletal: On examination of the right foot there is no gross deformity, erythema, soft tissue swelling, abrasions or ecchymosis. Skin is intact and there is no  evidence of fungal infection. Nail is intact. There is minimal tenderness on palpation of the digit. Neurologic:  Normal speech and language. No gross focal neurologic deficits are appreciated. No gait instability. Skin:  Skin is warm, dry and intact. No rash noted. No ecchymosis, erythema or abrasions noted. Psychiatric: Mood and affect are normal. Speech and behavior are normal.  ____________________________________________   LABS (all labs ordered are listed, but only abnormal results are displayed)  Labs Reviewed  URINALYSIS, COMPLETE (UACMP) WITH MICROSCOPIC - Abnormal;  Notable for the following:       Result Value   Color, Urine YELLOW (*)    APPearance CLEAR (*)    All other components within normal limits    RADIOLOGY  X-ray of the right fifth toe is negative for fracture or dislocation. I, Tommi Rumpshonda L Summers, personally viewed and evaluated these images (plain radiographs) as part of my medical decision making, as well as reviewing the written report by the radiologist. ____________________________________________   PROCEDURES  Procedure(s) performed: None  Procedures  Critical Care performed: No  ____________________________________________   INITIAL IMPRESSION / ASSESSMENT AND PLAN / ED COURSE  Pertinent labs & imaging results that were available during my care of the patient were reviewed by me and considered in my medical decision making (see chart for details).  Patient was given Toradol 30 mg IM while in the emergency room due to his pain and lack of over-the-counter medication. Patient was made aware that his urine was negative for blood or infection. He is to follow-up with his primary care doctor. Patient initially had elevated blood pressure in triage. Patient states that he was on blood pressure medication but decided to take himself off of it. This was not a suggestion from his PCP. He is encouraged to follow-up with his PCP about his blood pressure and also for further evaluation of his foot. He was discharged with prescription for meloxicam 15 mg one daily for 2 weeks.      ____________________________________________   FINAL CLINICAL IMPRESSION(S) / ED DIAGNOSES  Final diagnoses:  Foot pain, right      NEW MEDICATIONS STARTED DURING THIS VISIT:  Discharge Medication List as of 07/15/2016  6:33 PM       Note:  This document was prepared using Dragon voice recognition software and may include unintentional dictation errors.    Tommi Rumpshonda L Summers, PA-C 07/16/16 0022    Loleta Roseory Forbach, MD 07/16/16 [phone]

## 2016-12-26 ENCOUNTER — Encounter: Payer: Self-pay | Admitting: Emergency Medicine

## 2016-12-26 ENCOUNTER — Emergency Department: Payer: Self-pay

## 2016-12-26 ENCOUNTER — Emergency Department
Admission: EM | Admit: 2016-12-26 | Discharge: 2016-12-26 | Disposition: A | Discharge status: Home / Self Care | Payer: Self-pay | Attending: Emergency Medicine | Admitting: Emergency Medicine

## 2016-12-26 DIAGNOSIS — F17210 Nicotine dependence, cigarettes, uncomplicated: Secondary | ICD-10-CM | POA: Insufficient documentation

## 2016-12-26 DIAGNOSIS — J45909 Unspecified asthma, uncomplicated: Secondary | ICD-10-CM | POA: Insufficient documentation

## 2016-12-26 DIAGNOSIS — Z79899 Other long term (current) drug therapy: Secondary | ICD-10-CM | POA: Insufficient documentation

## 2016-12-26 DIAGNOSIS — M1711 Unilateral primary osteoarthritis, right knee: Secondary | ICD-10-CM | POA: Insufficient documentation

## 2016-12-26 DIAGNOSIS — I10 Essential (primary) hypertension: Secondary | ICD-10-CM | POA: Insufficient documentation

## 2016-12-26 MED ORDER — TRAMADOL HCL 50 MG PO TABS: 50.0000 mg | ORAL_TABLET | Freq: Four times a day (QID) | ORAL | 0 refills | Status: AC | PRN

## 2016-12-26 MED ORDER — MELOXICAM 15 MG PO TABS: 15.0000 mg | ORAL_TABLET | Freq: Every day | ORAL | 0 refills | Status: DC

## 2016-12-26 NOTE — ED Provider Notes (Signed)
[facility] Emergency Department Provider Note   ____________________________________________   None    (approximate)  I have reviewed the triage vital signs and the nursing notes.   HISTORY  Chief Complaint Knee Pain    HPI Anthony [name] is a 36 y.o. male patient complaining of right knee pain this increased the past 2 months. Patient said he went to his PCP earlier this week had a cortisone shot with no improvement. Patient stated he injured his knee 20 years ago but is hurting worse the past few days. Patient has a history of arthritis of the left knee.Patient rates his pain as a 7/10. Patient describes the pain as "achy/sharp". No palliative measures for complaint.   Past Medical History:  Diagnosis Date  . Arthritis   . Asthma   . High cholesterol   . Hypertension     There are no active problems to display for this patient.   Past Surgical History:  Procedure Laterality Date  . CHOLECYSTECTOMY    . SHOULDER ARTHROSCOPY      Prior to Admission medications   Medication Sig Start Date End Date Taking? Authorizing Provider  lisinopril (PRINIVIL,ZESTRIL) 20 MG tablet Take 20 mg by mouth daily.   Yes [provider]  pantoprazole (PROTONIX) 40 MG tablet Take 40 mg by mouth daily.   Yes [provider]  meloxicam (MOBIC) 15 MG tablet Take 1 tablet (15 mg total) by mouth daily. 07/15/16 07/15/17  Tommi Rumps, PA-C  meloxicam (MOBIC) 15 MG tablet Take 1 tablet (15 mg total) by mouth daily. 12/26/16   Joni Reining, PA-C  traMADol (ULTRAM) 50 MG tablet Take 1 tablet (50 mg total) by mouth every 6 (six) hours as needed. 12/26/16 12/26/17  Joni Reining, PA-C    Allergies Hydrocodone  No family history on file.  Social History Social History  Substance Use Topics  . Smoking status: Current Every Day Smoker    Packs/day: 1.00    Types: Cigarettes  . Smokeless tobacco: Never Used  . Alcohol use No    Review  of Systems Constitutional: No fever/chills Eyes: No visual changes. ENT: No sore throat. Cardiovascular: Denies chest pain. Respiratory: Denies shortness of breath. Gastrointestinal: No abdominal pain.  No nausea, no vomiting.  No diarrhea.  No constipation. Genitourinary: Negative for dysuria. Musculoskeletal: Positive for right knee pain. Skin: Negative for rash. Neurological: Negative for headaches, focal weakness or numbness. Endocrine:Hypertension hyperlipidemia Allergic/Immunilogical: Hydrocodone  ____________________________________________   PHYSICAL EXAM:  VITAL SIGNS: ED Triage Vitals  Enc Vitals Group     BP 12/26/16 1652 (!) 144/94     Pulse Rate 12/26/16 1652 100     Resp 12/26/16 1652 18     Temp 12/26/16 1652 97.6 F (36.4 C)     Temp Source 12/26/16 1652 Oral     SpO2 12/26/16 1652 98 %     Weight 12/26/16 1648 280 lb (127 kg)     Height 12/26/16 1648 5\' 11"  (1.803 m)     Head Circumference --      Peak Flow --      Pain Score 12/26/16 1647 7     Pain Loc --      Pain Edu? --      Excl. in GC? --     Constitutional: Alert and oriented. Well appearing and in no acute distress. Neck: No stridor.  No cervical spine tenderness to palpation. Hematological/Lymphatic/Immunilogical: No cervical lymphadenopathy. Cardiovascular: Normal rate, regular  rhythm. Grossly normal heart sounds.  Good peripheral circulation. Respiratory: Normal respiratory effort.  No retractions. Lungs CTAB. Gastrointestinal: Soft and nontender. No distention. No abdominal bruits. No CVA tenderness. Musculoskeletal:No obvious deformity to the right knee. Moderate crepitus to palpation. Decreased range of motion with extension limited by complaining of pain. Neurologic:  Normal speech and language. No gross focal neurologic deficits are appreciated. No gait instability. Skin:  Skin is warm, dry and intact. No rash noted. Psychiatric: Mood and affect are normal. Speech and behavior are  normal.  ____________________________________________   LABS (all labs ordered are listed, but only abnormal results are displayed)  Labs Reviewed - No data to display ____________________________________________  EKG   ____________________________________________  RADIOLOGY  Dg Knee Complete 4 Views Right  Result Date: 12/26/2016 CLINICAL DATA:  Right knee pain for 2 months without new injury. EXAM: RIGHT KNEE - COMPLETE 4+ VIEW COMPARISON:  Radiographs of October 18, 2012. FINDINGS: No evidence of fracture, dislocation, or joint effusion. Moderate narrowing of lateral joint space is noted. Mild medial narrowing is noted with osteophyte formation. Calcified loose bodies are again noted anteriorly and posteriorly. Mild narrowing of patellofemoral space is noted with osteophyte formation. Soft tissues are unremarkable. IMPRESSION: Moderate degenerative joint disease. No acute abnormality seen in the right knee. Electronically Signed   By: Lupita RaiderJames  Green Jr, M.D.   On: 12/26/2016 17:43    ___X-ray finding consistent with arthritis  with multiple loose bodies   PROCEDURES  Procedure(s) performed: None  Procedures  Critical Care performed: No  ____________________________________________   INITIAL IMPRESSION / ASSESSMENT AND PLAN / ED COURSE  Pertinent labs & imaging results that were available during my care of the patient were reviewed by me and considered in my medical decision making (see chart for details).  Right knee pain secondary to arthritis. Patient given discharge care instructions. Patient advised follow with PCP for consult orthopedics. Patient placed in a knee immobilizer and advised take medication as directed. Patient given a work note.      ____________________________________________   FINAL CLINICAL IMPRESSION(S) / ED DIAGNOSES  Final diagnoses:  Primary osteoarthritis of right knee      NEW MEDICATIONS STARTED DURING THIS VISIT:  New  Prescriptions   MELOXICAM (MOBIC) 15 MG TABLET    Take 1 tablet (15 mg total) by mouth daily.   TRAMADOL (ULTRAM) 50 MG TABLET    Take 1 tablet (50 mg total) by mouth every 6 (six) hours as needed.     Note:  This document was prepared using Dragon voice recognition software and may include unintentional dictation errors.    Joni ReiningSmith, Ronald K, PA-C 12/26/16 Samson [id number]    [place], [place], MD 12/27/16 [phone]

## 2016-12-26 NOTE — ED Triage Notes (Signed)
Patient presents to the ED with right knee pain x 2 months.  Patient states he went to PCP and had a cortisone shot but that it did not improve pain.  Patient states he injured his knee 20 years ago but it has never hurt as badly as it has been hurting over the past few days.  Patient is ambulatory with slight limp.  Patient is in no obvious distress at this time.

## 2016-12-26 NOTE — ED Notes (Signed)
Knee immobilizer applied. Pt instructed on use, how to apply.

## 2016-12-26 NOTE — ED Notes (Signed)
See triage note    States had an injury several years ago  But developed increased right knee pain over the past 2 months  States he had cortisone injection to same knee by PCP   No relief

## 2016-12-26 NOTE — Discharge Instructions (Signed)
Knee immobilizer for 2-3 days as needed

## 2017-01-01 ENCOUNTER — Emergency Department
Admission: EM | Admit: 2017-01-01 | Discharge: 2017-01-01 | Disposition: A | Discharge status: Home / Self Care | Payer: Self-pay | Attending: Student in an Organized Health Care Education/Training Program | Admitting: Student in an Organized Health Care Education/Training Program

## 2017-01-01 ENCOUNTER — Emergency Department: Payer: Self-pay

## 2017-01-01 DIAGNOSIS — I10 Essential (primary) hypertension: Secondary | ICD-10-CM | POA: Insufficient documentation

## 2017-01-01 DIAGNOSIS — Z79899 Other long term (current) drug therapy: Secondary | ICD-10-CM | POA: Insufficient documentation

## 2017-01-01 DIAGNOSIS — Z791 Long term (current) use of non-steroidal anti-inflammatories (NSAID): Secondary | ICD-10-CM | POA: Insufficient documentation

## 2017-01-01 DIAGNOSIS — R109 Unspecified abdominal pain: Secondary | ICD-10-CM

## 2017-01-01 DIAGNOSIS — N201 Calculus of ureter: Secondary | ICD-10-CM | POA: Insufficient documentation

## 2017-01-01 DIAGNOSIS — R1032 Left lower quadrant pain: Secondary | ICD-10-CM | POA: Insufficient documentation

## 2017-01-01 DIAGNOSIS — J45909 Unspecified asthma, uncomplicated: Secondary | ICD-10-CM | POA: Insufficient documentation

## 2017-01-01 DIAGNOSIS — F17210 Nicotine dependence, cigarettes, uncomplicated: Secondary | ICD-10-CM | POA: Insufficient documentation

## 2017-01-01 HISTORY — DX: Diverticulitis of intestine, part unspecified, without perforation or abscess without bleeding: K57.92

## 2017-01-01 LAB — URINALYSIS, COMPLETE (UACMP) WITH MICROSCOPIC
Bacteria, UA: NONE SEEN
Bilirubin Urine: NEGATIVE
GLUCOSE, UA: NEGATIVE mg/dL
Ketones, ur: NEGATIVE mg/dL
Leukocytes, UA: NEGATIVE
Nitrite: NEGATIVE
PROTEIN: NEGATIVE mg/dL
SPECIFIC GRAVITY, URINE: 1.018 (ref 1.005–1.030)
Squamous Epithelial / HPF: NONE SEEN
pH: 7 (ref 5.0–8.0)

## 2017-01-01 LAB — CBC
HCT: 42.7 % (ref 40.0–52.0)
Hemoglobin: 15 g/dL (ref 13.0–18.0)
MCH: 31.2 pg (ref 26.0–34.0)
MCHC: 35.2 g/dL (ref 32.0–36.0)
MCV: 88.6 fL (ref 80.0–100.0)
PLATELETS: 210 10*3/uL (ref 150–440)
RBC: 4.81 MIL/uL (ref 4.40–5.90)
RDW: 12.8 % (ref 11.5–14.5)
WBC: 8.6 10*3/uL (ref 3.8–10.6)

## 2017-01-01 LAB — COMPREHENSIVE METABOLIC PANEL
ALBUMIN: 4 g/dL (ref 3.5–5.0)
ALK PHOS: 103 U/L (ref 38–126)
ALT: 27 U/L (ref 17–63)
AST: 27 U/L (ref 15–41)
Anion gap: 7 (ref 5–15)
BUN: 14 mg/dL (ref 6–20)
CALCIUM: 9 mg/dL (ref 8.9–10.3)
CHLORIDE: 107 mmol/L (ref 101–111)
CO2: 27 mmol/L (ref 22–32)
CREATININE: 1.01 mg/dL (ref 0.61–1.24)
GFR calc non Af Amer: >60 mL/min (ref >60)
GLUCOSE: 114 mg/dL — AB (ref 65–99)
Potassium: 4.3 mmol/L (ref 3.5–5.1)
SODIUM: 141 mmol/L (ref 135–145)
Total Bilirubin: 0.6 mg/dL (ref 0.3–1.2)
Total Protein: 7.2 g/dL (ref 6.5–8.1)

## 2017-01-01 LAB — LIPASE, BLOOD: LIPASE: 43 U/L (ref 11–51)

## 2017-01-01 MED ORDER — PROMETHAZINE HCL 12.5 MG PO TABS: 12.5000 mg | ORAL_TABLET | Freq: Four times a day (QID) | ORAL | 0 refills | Status: DC | PRN

## 2017-01-01 MED ORDER — TRAMADOL HCL 50 MG PO TABS
50.0000 mg | ORAL_TABLET | Freq: Once | ORAL | Status: AC
  Administered 2017-01-01: 50 mg via ORAL
  Filled 2017-01-01: qty 1

## 2017-01-01 MED ORDER — NAPROXEN 500 MG PO TABS
500.0000 mg | ORAL_TABLET | Freq: Two times a day (BID) | ORAL | 0 refills | Status: AC
Start: 2017-01-01 — End: 2018-01-01

## 2017-01-01 MED ORDER — SODIUM CHLORIDE 0.9 % IV BOLUS (SEPSIS)
1000.0000 mL | Freq: Once | INTRAVENOUS | Status: AC
  Administered 2017-01-01: 1000 mL via INTRAVENOUS

## 2017-01-01 MED ORDER — KETOROLAC TROMETHAMINE 30 MG/ML IJ SOLN
15.0000 mg | Freq: Once | INTRAMUSCULAR | Status: AC
  Administered 2017-01-01: 15 mg via INTRAVENOUS
  Filled 2017-01-01: qty 1

## 2017-01-01 MED ORDER — TRAMADOL HCL 50 MG PO TABS: 50.0000 mg | ORAL_TABLET | Freq: Four times a day (QID) | ORAL | 0 refills | Status: AC | PRN

## 2017-01-01 MED ORDER — MORPHINE SULFATE (PF) 4 MG/ML IV SOLN
4.0000 mg | INTRAVENOUS | Status: DC | PRN
  Administered 2017-01-01: 4 mg via INTRAVENOUS
  Filled 2017-01-01: qty 1

## 2017-01-01 MED ORDER — ONDANSETRON HCL 4 MG/2ML IJ SOLN
4.0000 mg | Freq: Once | INTRAMUSCULAR | Status: AC
  Administered 2017-01-01: 4 mg via INTRAVENOUS
  Filled 2017-01-01: qty 2

## 2017-01-01 MED ORDER — MORPHINE SULFATE (PF) 4 MG/ML IV SOLN: 4.0000 mg | INTRAVENOUS | Status: DC | PRN

## 2017-01-01 MED ORDER — TAMSULOSIN HCL 0.4 MG PO CAPS: 0.4000 mg | ORAL_CAPSULE | Freq: Every day | ORAL | 0 refills | Status: DC

## 2017-01-01 NOTE — ED Provider Notes (Signed)
[facility] Emergency Department Provider Note    First MD Initiated Contact with Patient 01/01/17 1816     (approximate)  I have reviewed the triage vital signs and the nursing notes.   HISTORY  Chief Complaint Abdominal Pain    HPI Anthony [name] is a 36 y.o. male presents with sudden onset left-sided flank pain wrapping around to the left lower quadrant associated with nausea but no vomiting. No measured fevers. Patient does have a history of kidney stones but this feels more severe and was more sudden in onset. Rates pain as 10 out of 10 in severity. No trauma. No rashes. No shortness of breath or chest pain.   Past Medical History:  Diagnosis Date  . Arthritis   . Asthma   . Diverticulitis   . High cholesterol   . Hypertension    No family history on file. Past Surgical History:  Procedure Laterality Date  . CHOLECYSTECTOMY    . SHOULDER ARTHROSCOPY     There are no active problems to display for this patient.     Prior to Admission medications   Medication Sig Start Date End Date Taking? Authorizing Provider  lisinopril (PRINIVIL,ZESTRIL) 20 MG tablet Take 20 mg by mouth daily.    [provider]  meloxicam (MOBIC) 15 MG tablet Take 1 tablet (15 mg total) by mouth daily. 07/15/16 07/15/17  Tommi Rumps, PA-C  meloxicam (MOBIC) 15 MG tablet Take 1 tablet (15 mg total) by mouth daily. 12/26/16   Joni Reining, PA-C  naproxen (NAPROSYN) 500 MG tablet Take 1 tablet (500 mg total) by mouth 2 (two) times daily with a meal. 01/01/17 01/01/18  Willy Eddy, MD  pantoprazole (PROTONIX) 40 MG tablet Take 40 mg by mouth daily.    [provider]  promethazine (PHENERGAN) 12.5 MG tablet Take 1 tablet (12.5 mg total) by mouth every 6 (six) hours as needed for nausea or vomiting. 01/01/17   Willy Eddy, MD  tamsulosin (FLOMAX) 0.4 MG CAPS capsule Take 1 capsule (0.4 mg total) by mouth daily after supper. 01/01/17    Willy Eddy, MD  traMADol (ULTRAM) 50 MG tablet Take 1 tablet (50 mg total) by mouth every 6 (six) hours as needed. 12/26/16 12/26/17  Joni Reining, PA-C  traMADol (ULTRAM) 50 MG tablet Take 1 tablet (50 mg total) by mouth every 6 (six) hours as needed. 01/01/17 01/01/18  Willy Eddy, MD    Allergies Hydrocodone    Social History Social History  Substance Use Topics  . Smoking status: Current Every Day Smoker    Packs/day: 1.00    Types: Cigarettes  . Smokeless tobacco: Never Used  . Alcohol use No    Review of Systems Patient denies headaches, rhinorrhea, blurry vision, numbness, shortness of breath, chest pain, edema, cough, abdominal pain, nausea, vomiting, diarrhea, dysuria, fevers, rashes or hallucinations unless otherwise stated above in HPI. ____________________________________________   PHYSICAL EXAM:  VITAL SIGNS: Vitals:   01/01/17 1749 01/01/17 2049  BP: (!) 146/113 133/71  Pulse: 84 86  Resp: 16 19  Temp: 98 F (36.7 C)   SpO2: 98% 99%    Constitutional: Alert and oriented. Well appearing and in no acute distress. Eyes: Conjunctivae are normal.  Head: Atraumatic. Nose: No congestion/rhinnorhea. Mouth/Throat: Mucous membranes are moist.   Neck: No stridor. Painless ROM.  Cardiovascular: Normal rate, regular rhythm. Grossly normal heart sounds.  Good peripheral circulation. Respiratory: Normal respiratory effort.  No retractions. Lungs CTAB. Gastrointestinal:  Soft and nontender. No distention. No abdominal bruits. +left sided CVA tenderness. Genitourinary:  Musculoskeletal: No lower extremity tenderness nor edema.  No joint effusions. Neurologic:  Normal speech and language. No gross focal neurologic deficits are appreciated. No facial droop Skin:  Skin is warm, dry and intact. No rash noted. Psychiatric: Mood and affect are normal. Speech and behavior are normal.  ____________________________________________   LABS (all labs ordered are  listed, but only abnormal results are displayed)  Results for orders placed or performed during the hospital encounter of 01/01/17 (from the past 24 hour(s))  Lipase, blood     Status: None   Collection Time: 01/01/17  5:50 PM  Result Value Ref Range   Lipase 43 11 - 51 U/L  Comprehensive metabolic panel     Status: Abnormal   Collection Time: 01/01/17  5:50 PM  Result Value Ref Range   Sodium 141 135 - 145 mmol/L   Potassium 4.3 3.5 - 5.1 mmol/L   Chloride 107 101 - 111 mmol/L   CO2 27 22 - 32 mmol/L   Glucose, Bld 114 (H) 65 - 99 mg/dL   BUN 14 6 - 20 mg/dL   Creatinine, Ser 1.61 0.61 - 1.24 mg/dL   Calcium 9.0 8.9 - 09.6 mg/dL   Total Protein 7.2 6.5 - 8.1 g/dL   Albumin 4.0 3.5 - 5.0 g/dL   AST 27 15 - 41 U/L   ALT 27 17 - 63 U/L   Alkaline Phosphatase 103 38 - 126 U/L   Total Bilirubin 0.6 0.3 - 1.2 mg/dL   GFR calc non Af Amer >60 >60 mL/min   GFR calc Af Amer >60 >60 mL/min   Anion gap 7 5 - 15  CBC     Status: None   Collection Time: 01/01/17  5:50 PM  Result Value Ref Range   WBC 8.6 3.8 - 10.6 K/uL   RBC 4.81 4.40 - 5.90 MIL/uL   Hemoglobin 15.0 13.0 - 18.0 g/dL   HCT 04.5 40.9 - 81.1 %   MCV 88.6 80.0 - 100.0 fL   MCH 31.2 26.0 - 34.0 pg   MCHC 35.2 32.0 - 36.0 g/dL   RDW 91.4 78.2 - 95.6 %   Platelets 210 150 - 440 K/uL  Urinalysis, Complete w Microscopic     Status: Abnormal   Collection Time: 01/01/17  5:50 PM  Result Value Ref Range   Color, Urine YELLOW (A) YELLOW   APPearance CLEAR (A) CLEAR   Specific Gravity, Urine 1.018 1.005 - 1.030   pH 7.0 5.0 - 8.0   Glucose, UA NEGATIVE NEGATIVE mg/dL   Hgb urine dipstick MODERATE (A) NEGATIVE   Bilirubin Urine NEGATIVE NEGATIVE   Ketones, ur NEGATIVE NEGATIVE mg/dL   Protein, ur NEGATIVE NEGATIVE mg/dL   Nitrite NEGATIVE NEGATIVE   Leukocytes, UA NEGATIVE NEGATIVE   RBC / HPF TOO NUMEROUS TO COUNT 0 - 5 RBC/hpf   WBC, UA 0-5 0 - 5 WBC/hpf   Bacteria, UA NONE SEEN NONE SEEN   Squamous Epithelial / LPF  NONE SEEN NONE SEEN   ____________________________________________ ______________________________  RADIOLOGY  I personally reviewed all radiographic images ordered to evaluate for the above acute complaints and reviewed radiology reports and findings.  These findings were personally discussed with the patient.  Please see medical record for radiology report.  ____________________________________________   PROCEDURES  Procedure(s) performed:  Procedures    Critical Care performed: no ____________________________________________   INITIAL IMPRESSION / ASSESSMENT AND PLAN /  ED COURSE  Pertinent labs & imaging results that were available during my care of the patient were reviewed by me and considered in my medical decision making (see chart for details).  DDX: stone, pyelo, AAA, msk strain, diverticultiis, colitis  Sharee Holsterorman E Zollner [name] is a 36 y.o. who presents to the ED with Hx of stones and diverticultiis p/w acute left flank pain. No fevers, no systemic symptoms. - urinary symptoms. Denies trauma or injury. Afebrile in ED. Exam as above. Flank TTP, otherwise abdominal exam is benign. No peritoneal signs. Possible kidney stone, cystitis, or pyelonephritis.   Checking urine. UA with blood, no infection CT Stone with mild left hydro with 4mm ureteral stone Clinical picture is not consistent with appendicitis, diverticulitis, pancreatitis, cholecystitis, bowel perforation, aortic dissection, splenic injury or acute abdominal process at this time.   ----------------------------------------- 8:55 PM on 01/01/2017 -----------------------------------------   Pain improved, tolerating PO. Repeat ABD exam benign, will plan supportive treatment and early follow up for recheck.       ____________________________________________   FINAL CLINICAL IMPRESSION(S) / ED DIAGNOSES  Final diagnoses:  Acute left flank pain  Ureterolithiasis      NEW MEDICATIONS STARTED DURING  THIS VISIT:  New Prescriptions   NAPROXEN (NAPROSYN) 500 MG TABLET    Take 1 tablet (500 mg total) by mouth 2 (two) times daily with a meal.   PROMETHAZINE (PHENERGAN) 12.5 MG TABLET    Take 1 tablet (12.5 mg total) by mouth every 6 (six) hours as needed for nausea or vomiting.   TAMSULOSIN (FLOMAX) 0.4 MG CAPS CAPSULE    Take 1 capsule (0.4 mg total) by mouth daily after supper.   TRAMADOL (ULTRAM) 50 MG TABLET    Take 1 tablet (50 mg total) by mouth every 6 (six) hours as needed.     Note:  This document was prepared using Dragon voice recognition software and may include unintentional dictation errors.    Willy Eddyobinson, Mostyn Varnell, MD 01/01/17 2056

## 2017-01-01 NOTE — ED Notes (Signed)

## 2017-01-01 NOTE — ED Triage Notes (Signed)
Pt c/o LLQ pain that radiates around to the back with nausea for the past 30 min..Marland Kitchen

## 2017-01-13 DIAGNOSIS — R310 Gross hematuria: Secondary | ICD-10-CM | POA: Insufficient documentation

## 2017-01-13 DIAGNOSIS — Z79899 Other long term (current) drug therapy: Secondary | ICD-10-CM | POA: Insufficient documentation

## 2017-01-13 DIAGNOSIS — I10 Essential (primary) hypertension: Secondary | ICD-10-CM | POA: Insufficient documentation

## 2017-01-13 DIAGNOSIS — J45909 Unspecified asthma, uncomplicated: Secondary | ICD-10-CM | POA: Insufficient documentation

## 2017-01-13 DIAGNOSIS — N200 Calculus of kidney: Secondary | ICD-10-CM | POA: Insufficient documentation

## 2017-01-13 DIAGNOSIS — F17210 Nicotine dependence, cigarettes, uncomplicated: Secondary | ICD-10-CM | POA: Insufficient documentation

## 2017-01-13 LAB — BASIC METABOLIC PANEL
Anion gap: 9 (ref 5–15)
BUN: 20 mg/dL (ref 6–20)
CHLORIDE: 108 mmol/L (ref 101–111)
CO2: 22 mmol/L (ref 22–32)
CREATININE: 1.44 mg/dL — AB (ref 0.61–1.24)
Calcium: 9 mg/dL (ref 8.9–10.3)
Glucose, Bld: 128 mg/dL — ABNORMAL HIGH (ref 65–99)
POTASSIUM: 3.9 mmol/L (ref 3.5–5.1)
SODIUM: 139 mmol/L (ref 135–145)

## 2017-01-13 LAB — CBC
HEMATOCRIT: 41.8 % (ref 40.0–52.0)
Hemoglobin: 14.7 g/dL (ref 13.0–18.0)
MCH: 31 pg (ref 26.0–34.0)
MCHC: 35.3 g/dL (ref 32.0–36.0)
MCV: 87.7 fL (ref 80.0–100.0)
PLATELETS: 224 10*3/uL (ref 150–440)
RBC: 4.76 MIL/uL (ref 4.40–5.90)
RDW: 12.9 % (ref 11.5–14.5)
WBC: 13.8 10*3/uL — AB (ref 3.8–10.6)

## 2017-01-13 MED ORDER — FENTANYL CITRATE (PF) 100 MCG/2ML IJ SOLN
50.0000 ug | INTRAMUSCULAR | Status: DC | PRN
  Administered 2017-01-13: 50 ug via NASAL

## 2017-01-13 MED ORDER — ONDANSETRON 4 MG PO TBDP
4.0000 mg | ORAL_TABLET | Freq: Once | ORAL | Status: AC
  Administered 2017-01-13: 4 mg via ORAL

## 2017-01-13 MED ORDER — FENTANYL CITRATE (PF) 100 MCG/2ML IJ SOLN
INTRAMUSCULAR | Status: AC
  Filled 2017-01-13: qty 2

## 2017-01-13 MED ORDER — ONDANSETRON 4 MG PO TBDP
ORAL_TABLET | ORAL | Status: AC
  Filled 2017-01-13: qty 1

## 2017-01-13 NOTE — ED Triage Notes (Signed)
Patient reports left flank pain that radiates around into left lower quad.  Patient reports diagnosed last week with kidney stone.

## 2017-01-14 ENCOUNTER — Emergency Department
Admission: EM | Admit: 2017-01-14 | Discharge: 2017-01-14 | Disposition: A | Discharge status: Home / Self Care | Payer: Self-pay | Attending: Emergency Medicine | Admitting: Emergency Medicine

## 2017-01-14 DIAGNOSIS — R109 Unspecified abdominal pain: Secondary | ICD-10-CM

## 2017-01-14 DIAGNOSIS — N200 Calculus of kidney: Secondary | ICD-10-CM

## 2017-01-14 LAB — URINALYSIS, COMPLETE (UACMP) WITH MICROSCOPIC
BILIRUBIN URINE: NEGATIVE
Bacteria, UA: NONE SEEN
GLUCOSE, UA: NEGATIVE mg/dL
Ketones, ur: NEGATIVE mg/dL
LEUKOCYTES UA: NEGATIVE
Nitrite: NEGATIVE
Protein, ur: 30 mg/dL — AB
SPECIFIC GRAVITY, URINE: 1.01 (ref 1.005–1.030)
SQUAMOUS EPITHELIAL / LPF: NONE SEEN
pH: 6 (ref 5.0–8.0)

## 2017-01-14 NOTE — ED Provider Notes (Signed)
[facility] Emergency Department Provider Note   ____________________________________________   First MD Initiated Contact with Patient 01/14/17 0121     (approximate)  I have reviewed the triage vital signs and the nursing notes.   HISTORY  Chief Complaint Abdominal Pain and Flank Pain    HPI Anthony [name] is a 36 y.o. male who comes in today with some left flank pain. He reports that he was here a little bit over a week ago and diagnosed with a kidney stone. He was given medication for pain and discharged home. Ports that he had been doing well and didn't even need to fill his prescriptions. He reports that today though he started having some pain. He had to give a urine sample earlier this morning and could not fill a cup. He reports though that the left flank pain started as pressure and then became more and more intense as the night wore on. The patient also reports that his urine output decreased as well. He states that the pain goes from his left flank and wraps around to the front of his abdomen. He states that here in the waiting room he felt a sensation in his genital area and when he went to the bathroom he urinated a lot of blood and a little black spot which she assumes was his kidney stone. He hadn't passed his kidney stone from when he was previously seen. Since he has urinated his pain is improved and is now a 2 out of 10 in intensity. The patient came in for evaluation. He denies any nausea or vomiting.   Past Medical History:  Diagnosis Date  . Arthritis   . Asthma   . Diverticulitis   . High cholesterol   . Hypertension     There are no active problems to display for this patient.   Past Surgical History:  Procedure Laterality Date  . CHOLECYSTECTOMY    . SHOULDER ARTHROSCOPY      Prior to Admission medications   Medication Sig Start Date End Date Taking? Authorizing Provider  lisinopril (PRINIVIL,ZESTRIL) 20 MG tablet Take  20 mg by mouth daily.    [provider]  meloxicam (MOBIC) 15 MG tablet Take 1 tablet (15 mg total) by mouth daily. 07/15/16 07/15/17  Tommi Rumps, PA-C  meloxicam (MOBIC) 15 MG tablet Take 1 tablet (15 mg total) by mouth daily. 12/26/16   Joni Reining, PA-C  naproxen (NAPROSYN) 500 MG tablet Take 1 tablet (500 mg total) by mouth 2 (two) times daily with a meal. 01/01/17 01/01/18  Willy Eddy, MD  pantoprazole (PROTONIX) 40 MG tablet Take 40 mg by mouth daily.    [provider]  promethazine (PHENERGAN) 12.5 MG tablet Take 1 tablet (12.5 mg total) by mouth every 6 (six) hours as needed for nausea or vomiting. 01/01/17   Willy Eddy, MD  tamsulosin (FLOMAX) 0.4 MG CAPS capsule Take 1 capsule (0.4 mg total) by mouth daily after supper. 01/01/17   Willy Eddy, MD  traMADol (ULTRAM) 50 MG tablet Take 1 tablet (50 mg total) by mouth every 6 (six) hours as needed. 12/26/16 12/26/17  Joni Reining, PA-C  traMADol (ULTRAM) 50 MG tablet Take 1 tablet (50 mg total) by mouth every 6 (six) hours as needed. 01/01/17 01/01/18  Willy Eddy, MD    Allergies Hydrocodone  No family history on file.  Social History Social History  Substance Use Topics  . Smoking status: Current Every Day Smoker  Packs/day: 1.00    Types: Cigarettes  . Smokeless tobacco: Never Used  . Alcohol use No    Review of Systems  Constitutional: No fever/chills Eyes: No visual changes. ENT: No sore throat. Cardiovascular: Denies chest pain. Respiratory: Denies shortness of breath. Gastrointestinal: abdominal pain.  No nausea, no vomiting.  No diarrhea.  No constipation. Genitourinary: Negative for dysuria. Musculoskeletal: left back pain Skin: Negative for rash. Neurological: Negative for headaches, focal weakness or numbness.   ____________________________________________   PHYSICAL EXAM:  VITAL SIGNS: ED Triage Vitals  Enc Vitals Group     BP 01/14/17 0130 (!) 146/98      Pulse Rate 01/14/17 0130 90     Resp --      Temp --      Temp src --      SpO2 01/14/17 0130 96 %     Weight --      Height --      Head Circumference --      Peak Flow --      Pain Score 01/13/17 2310 10     Pain Loc --      Pain Edu? --      Excl. in GC? --     Constitutional: Alert and oriented. Well appearing and in no acute distress. Eyes: Conjunctivae are normal. PERRL. EOMI. Head: Atraumatic. Nose: No congestion/rhinnorhea. Mouth/Throat: Mucous membranes are moist.  Oropharynx non-erythematous. Cardiovascular: Normal rate, regular rhythm. Grossly normal heart sounds.  Good peripheral circulation. Respiratory: Normal respiratory effort.  No retractions. Lungs CTAB. Gastrointestinal: Soft and nontender. No distention. positive bowel sounds, no flank pain this time Musculoskeletal: No lower extremity tenderness nor edema.   Neurologic:  Normal speech and language.  Skin:  Skin is warm, dry and intact.  Psychiatric: Mood and affect are normal.   ____________________________________________   LABS (all labs ordered are listed, but only abnormal results are displayed)  Labs Reviewed  URINALYSIS, COMPLETE (UACMP) WITH MICROSCOPIC - Abnormal; Notable for the following:       Result Value   Color, Urine YELLOW (*)    APPearance CLEAR (*)    Hgb urine dipstick LARGE (*)    Protein, ur 30 (*)    All other components within normal limits  BASIC METABOLIC PANEL - Abnormal; Notable for the following:    Glucose, Bld 128 (*)    Creatinine, Ser 1.44 (*)    All other components within normal limits  CBC - Abnormal; Notable for the following:    WBC 13.8 (*)    All other components within normal limits   ____________________________________________  EKG  none ____________________________________________  RADIOLOGY  No results found.  ____________________________________________   PROCEDURES  Procedure(s) performed: None  Procedures  Critical Care performed:  No  ____________________________________________   INITIAL IMPRESSION / ASSESSMENT AND PLAN / ED COURSE  Pertinent labs & imaging results that were available during my care of the patient were reviewed by me and considered in my medical decision making (see chart for details).  this is a 36 year old male who comes into the hospital today with some left flank pain. The patient's pain improved by the time he was seen by me. I did wait for the patient to give [place] a urine sample and his urine shows too numerous to count red blood cells with 0-5 white blood cells and no bacteria. The patient's creatinine is elevated from previous but he states that his pain is improved and he did urinate what appeared to be a  stone. The patient will be discharged to follow-up with urology again and encouraged to fill his previous medications.      ____________________________________________   FINAL CLINICAL IMPRESSION(S) / ED DIAGNOSES  Final diagnoses:  Flank pain  Kidney stone      NEW MEDICATIONS STARTED DURING THIS VISIT:  Discharge Medication List as of 01/14/2017  2:12 AM       Note:  This document was prepared using Dragon voice recognition software and may include unintentional dictation errors.    Rebecka Apley, MD 01/14/17 [phone]

## 2017-01-14 NOTE — ED Notes (Signed)

## 2017-01-14 NOTE — Discharge Instructions (Signed)
Please follow up with urology. Please fill you medication so it is available should you need it in the future.

## 2017-01-14 NOTE — ED Notes (Signed)
Pt up to BR to void--passed small round object, st having difficulty urinating with left flank/abd pain; st here 2wks ago and dx with stone; did not get rx filled and has been pain-free until tonight; charge notifed and pt taken to room 9 for further eval

## 2017-03-27 ENCOUNTER — Emergency Department: Payer: Self-pay

## 2017-03-27 ENCOUNTER — Encounter: Payer: Self-pay | Admitting: *Deleted

## 2017-03-27 ENCOUNTER — Other Ambulatory Visit: Payer: Self-pay

## 2017-03-27 ENCOUNTER — Emergency Department
Admission: EM | Admit: 2017-03-27 | Discharge: 2017-03-27 | Disposition: A | Discharge status: Home / Self Care | Payer: Self-pay | Attending: Emergency Medicine | Admitting: Emergency Medicine

## 2017-03-27 DIAGNOSIS — Z79899 Other long term (current) drug therapy: Secondary | ICD-10-CM | POA: Insufficient documentation

## 2017-03-27 DIAGNOSIS — K5732 Diverticulitis of large intestine without perforation or abscess without bleeding: Secondary | ICD-10-CM | POA: Insufficient documentation

## 2017-03-27 DIAGNOSIS — I10 Essential (primary) hypertension: Secondary | ICD-10-CM | POA: Insufficient documentation

## 2017-03-27 DIAGNOSIS — R110 Nausea: Secondary | ICD-10-CM | POA: Insufficient documentation

## 2017-03-27 DIAGNOSIS — F17210 Nicotine dependence, cigarettes, uncomplicated: Secondary | ICD-10-CM | POA: Insufficient documentation

## 2017-03-27 DIAGNOSIS — Z885 Allergy status to narcotic agent status: Secondary | ICD-10-CM | POA: Insufficient documentation

## 2017-03-27 DIAGNOSIS — J45909 Unspecified asthma, uncomplicated: Secondary | ICD-10-CM | POA: Insufficient documentation

## 2017-03-27 LAB — CBC
HEMATOCRIT: 45.7 % (ref 40.0–52.0)
Hemoglobin: 15.7 g/dL (ref 13.0–18.0)
MCH: 30.8 pg (ref 26.0–34.0)
MCHC: 34.3 g/dL (ref 32.0–36.0)
MCV: 89.7 fL (ref 80.0–100.0)
Platelets: 229 10*3/uL (ref 150–440)
RBC: 5.09 MIL/uL (ref 4.40–5.90)
RDW: 13.2 % (ref 11.5–14.5)
WBC: 14.2 10*3/uL — AB (ref 3.8–10.6)

## 2017-03-27 LAB — COMPREHENSIVE METABOLIC PANEL
ALT: 30 U/L (ref 17–63)
AST: 29 U/L (ref 15–41)
Albumin: 4.2 g/dL (ref 3.5–5.0)
Alkaline Phosphatase: 100 U/L (ref 38–126)
Anion gap: 10 (ref 5–15)
BUN: 20 mg/dL (ref 6–20)
CHLORIDE: 103 mmol/L (ref 101–111)
CO2: 24 mmol/L (ref 22–32)
Calcium: 8.9 mg/dL (ref 8.9–10.3)
Creatinine, Ser: 1.02 mg/dL (ref 0.61–1.24)
Glucose, Bld: 137 mg/dL — ABNORMAL HIGH (ref 65–99)
POTASSIUM: 3.9 mmol/L (ref 3.5–5.1)
SODIUM: 137 mmol/L (ref 135–145)
Total Bilirubin: 1 mg/dL (ref 0.3–1.2)
Total Protein: 7.4 g/dL (ref 6.5–8.1)

## 2017-03-27 LAB — LACTIC ACID, PLASMA: LACTIC ACID, VENOUS: 1.3 mmol/L (ref 0.5–1.9)

## 2017-03-27 LAB — URINALYSIS, COMPLETE (UACMP) WITH MICROSCOPIC
BACTERIA UA: NONE SEEN
BILIRUBIN URINE: NEGATIVE
Glucose, UA: NEGATIVE mg/dL
Hgb urine dipstick: NEGATIVE
KETONES UR: NEGATIVE mg/dL
LEUKOCYTES UA: NEGATIVE
Nitrite: NEGATIVE
PROTEIN: 30 mg/dL — AB
SQUAMOUS EPITHELIAL / LPF: NONE SEEN
Specific Gravity, Urine: 1.028 (ref 1.005–1.030)
pH: 7 (ref 5.0–8.0)

## 2017-03-27 LAB — LIPASE, BLOOD: LIPASE: 37 U/L (ref 11–51)

## 2017-03-27 MED ORDER — MORPHINE SULFATE (PF) 4 MG/ML IV SOLN
4.0000 mg | Freq: Once | INTRAVENOUS | Status: AC
  Administered 2017-03-27: 4 mg via INTRAVENOUS
  Filled 2017-03-27: qty 1

## 2017-03-27 MED ORDER — SODIUM CHLORIDE 0.9 % IV BOLUS (SEPSIS)
1000.0000 mL | INTRAVENOUS | Status: AC
  Administered 2017-03-27: 1000 mL via INTRAVENOUS

## 2017-03-27 MED ORDER — METRONIDAZOLE IN NACL 5-0.79 MG/ML-% IV SOLN
500.0000 mg | Freq: Once | INTRAVENOUS | Status: AC
  Administered 2017-03-27: 500 mg via INTRAVENOUS
  Filled 2017-03-27: qty 100

## 2017-03-27 MED ORDER — ONDANSETRON HCL 4 MG PO TABS: ORAL_TABLET | ORAL | 0 refills | Status: DC

## 2017-03-27 MED ORDER — ONDANSETRON 4 MG PO TBDP: ORAL_TABLET | ORAL | 0 refills | Status: DC

## 2017-03-27 MED ORDER — KETOROLAC TROMETHAMINE 30 MG/ML IJ SOLN
15.0000 mg | Freq: Once | INTRAMUSCULAR | Status: AC
  Administered 2017-03-27: 15 mg via INTRAVENOUS
  Filled 2017-03-27: qty 1

## 2017-03-27 MED ORDER — ONDANSETRON HCL 4 MG/2ML IJ SOLN
4.0000 mg | INTRAMUSCULAR | Status: AC
  Administered 2017-03-27: 4 mg via INTRAVENOUS
  Filled 2017-03-27: qty 2

## 2017-03-27 MED ORDER — OXYCODONE-ACETAMINOPHEN 5-325 MG PO TABS: 1.0000 | ORAL_TABLET | Freq: Four times a day (QID) | ORAL | 0 refills | Status: DC | PRN

## 2017-03-27 MED ORDER — DEXTROSE 5 % IV SOLN
2.0000 g | Freq: Once | INTRAVENOUS | Status: AC
  Administered 2017-03-27: 2 g via INTRAVENOUS
  Filled 2017-03-27: qty 2

## 2017-03-27 MED ORDER — IOPAMIDOL (ISOVUE-300) INJECTION 61%
125.0000 mL | Freq: Once | INTRAVENOUS | Status: AC | PRN
Start: 2017-03-27 — End: 2017-03-27
  Administered 2017-03-27: 125 mL via INTRAVENOUS

## 2017-03-27 MED ORDER — DOCUSATE SODIUM 100 MG PO CAPS: ORAL_CAPSULE | ORAL | 0 refills | Status: DC

## 2017-03-27 MED ORDER — AMOXICILLIN-POT CLAVULANATE 875-125 MG PO TABS: 1.0000 | ORAL_TABLET | Freq: Three times a day (TID) | ORAL | 0 refills | Status: AC

## 2017-03-27 NOTE — ED Notes (Signed)
Gave pt a cup of water to drink.  

## 2017-03-27 NOTE — Discharge Instructions (Signed)
We believe your symptoms are caused by diverticulitis.  Most of the time this condition (please read through the included information) can be cured with outpatient antibiotics.  Please take the full course of prescribed medication(s) and follow up with the doctors recommended above.  Return to the ED if your abdominal pain worsens or fails to improve, you develop bloody vomiting, bloody diarrhea, you are unable to tolerate fluids due to vomiting, fever greater than 101, or other symptoms that concern you.  Take Percocet as prescribed for severe pain. Do not drink alcohol, drive or participate in any other potentially dangerous activities while taking this medication as it may make you sleepy. Do not take this medication with any other sedating medications, either prescription or over-the-counter. If you were prescribed Percocet or Vicodin, do not take these with acetaminophen (Tylenol) as it is already contained within these medications.   This medication is an opiate (or narcotic) pain medication and can be habit forming.  Use it as little as possible to achieve adequate pain control.  Do not use or use it with extreme caution if you have a history of opiate abuse or dependence.  If you are on a pain contract with your primary care doctor or a pain specialist, be sure to let them know you were prescribed this medication today from the  [facility].  This medication is intended for your use only - do not give any to anyone else and keep it in a secure place where nobody else, especially children, have access to it.  It will also cause or worsen constipation, so you may want to consider taking an over-the-counter stool softener while you are taking this medication.  

## 2017-03-27 NOTE — ED Triage Notes (Signed)
Pt to triage via wheelchair.  Pt has lower abd pain.  Pt reports diff urinating.  Sx for 1 day.  No v/d.  Pt alert.  Hx diverticulitis.

## 2017-03-27 NOTE — ED Notes (Signed)
Pt was diagnosed with diverticulitis in 2014 and he states its gradually getting worse. Pain is located in lower abdomin. A dull Pain started this afternoon around 4 or 5pm, became sharp pain around 11pm.

## 2017-03-27 NOTE — ED Provider Notes (Signed)
[facility] Emergency Department Provider Note  ____________________________________________   First MD Initiated Contact with Patient 03/27/17 [phone]     (approximate)  I have reviewed the triage vital signs and the nursing notes.   HISTORY  Chief Complaint Abdominal Pain    HPI Anthony [name] is a 36 y.o. male reports a history that includes 2 prior episodes of diverticulitis, 1 of which that occurred approximately a year ago required hospitalization for  A week [facility], who presents tonight by private vehicle for evaluation of gradually worsening lower abdominal pain that he describes as both aching and sharp/stabbing.  It feels the same as prior episodes of diverticulitis.  Bearing down and walking make the pain worse and nothing makes it better.  He has not had a bowel movement recently but he also denies constipation as well as diarrhea.  He denies fever/chills, chest pain, shortness of breath.  He has had nausea but no vomiting.  He reports that the pain is severe enough in his abdomen that it makes it difficult for him to urinate because just the action of urinating makes the abdominal pain worse.  The pain is severe and is constant but also the sharp pain comes in waves.   Past Medical History:  Diagnosis Date  . Arthritis   . Asthma   . Diverticulitis   . High cholesterol   . Hypertension     There are no active problems to display for this patient.   Past Surgical History:  Procedure Laterality Date  . CHOLECYSTECTOMY    . SHOULDER ARTHROSCOPY      Prior to Admission medications   Medication Sig Start Date End Date Taking? Authorizing Provider  diclofenac (VOLTAREN) 75 MG EC tablet Take 1 tablet by mouth 2 (two) times daily. 01/02/17  Yes [provider]  HYDROCHLOROTHIAZIDE PO Take 1 tablet by mouth daily.   Yes [provider]  lisinopril (PRINIVIL,ZESTRIL) 20 MG tablet Take 20 mg by mouth daily.   Yes [provider]  pantoprazole (PROTONIX) 40 MG tablet Take 40 mg by mouth daily.   Yes [provider]  phentermine 37.5 MG capsule Take 37.5 mg by mouth every morning.   Yes [provider]  amoxicillin-clavulanate (AUGMENTIN) 875-125 MG tablet Take 1 tablet by mouth every 8 (eight) hours for 10 days. 03/27/17 04/06/17  Loleta Rose, MD  docusate sodium (COLACE) 100 MG capsule Take 1 tablet once or twice daily as needed for constipation while taking narcotic pain medicine 03/27/17   Loleta Rose, MD  meloxicam (MOBIC) 15 MG tablet Take 1 tablet (15 mg total) by mouth daily. Patient not taking: Reported on 03/27/2017 07/15/16 07/15/17  Tommi Rumps, PA-C  meloxicam (MOBIC) 15 MG tablet Take 1 tablet (15 mg total) by mouth daily. Patient not taking: Reported on 03/27/2017 12/26/16   Joni Reining, PA-C  naproxen (NAPROSYN) 500 MG tablet Take 1 tablet (500 mg total) by mouth 2 (two) times daily with a meal. Patient not taking: Reported on 03/27/2017 01/01/17 01/01/18  Willy Eddy, MD  ondansetron [facility]) 4 MG tablet Take 1-2 tabs by mouth every 8 hours as needed for nausea/vomiting 03/27/17   Loleta Rose, MD  oxyCODONE-acetaminophen (ROXICET) 5-325 MG tablet Take 1-2 tablets by mouth every 6 (six) hours as needed for severe pain. 03/27/17   Loleta Rose, MD  promethazine (PHENERGAN) 12.5 MG tablet Take 1 tablet (12.5 mg total) by mouth every 6 (six) hours as needed for nausea  or vomiting. Patient not taking: Reported on 03/27/2017 01/01/17   Willy Eddy, MD  tamsulosin (FLOMAX) 0.4 MG CAPS capsule Take 1 capsule (0.4 mg total) by mouth daily after supper. Patient not taking: Reported on 03/27/2017 01/01/17   Willy Eddy, MD  traMADol (ULTRAM) 50 MG tablet Take 1 tablet (50 mg total) by mouth every 6 (six) hours as needed. Patient not taking: Reported on 03/27/2017 12/26/16 12/26/17  Joni Reining, PA-C  traMADol (ULTRAM) 50 MG tablet Take 1 tablet (50 mg total) by  mouth every 6 (six) hours as needed. Patient not taking: Reported on 03/27/2017 01/01/17 01/01/18  Willy Eddy, MD    Allergies Hydrocodone  No family history on file.  Social History Social History   Tobacco Use  . Smoking status: Current Every Day Smoker    Packs/day: 1.00    Types: Cigarettes  . Smokeless tobacco: Never Used  Substance Use Topics  . Alcohol use: No  . Drug use: No    Review of Systems Constitutional: No fever/chills Eyes: No visual changes. Cardiovascular: Denies chest pain. Respiratory: Denies shortness of breath. Gastrointestinal: Gradually worsening severe lower abdominal pain, nausea but no vomiting, questionable constipation Genitourinary: Negative for dysuria. Musculoskeletal: Negative for neck pain.  Negative for back pain. Integumentary: Negative for rash. Neurological: Negative for headaches, focal weakness or numbness.   ____________________________________________   PHYSICAL EXAM:  VITAL SIGNS: ED Triage Vitals  Enc Vitals Group     BP 03/27/17 0139 (!) 151/7     Pulse Rate 03/27/17 0139 (!) 105     Resp 03/27/17 0139 18     Temp 03/27/17 0139 97.6 F (36.4 C)     Temp Source 03/27/17 0139 Oral     SpO2 03/27/17 0139 99 %     Weight 03/27/17 0140 136.1 kg (300 lb)     Height 03/27/17 0140 1.803 m (5\' 11" )     Head Circumference --      Peak Flow --      Pain Score 03/27/17 0139 8     Pain Loc --      Pain Edu? --      Excl. in GC? --     Constitutional: Alert and oriented.  Appears uncomfortable and in at least mild if not moderate pain.  Nontoxic appearance Eyes: Conjunctivae are normal.  Head: Atraumatic. Nose: No congestion/rhinnorhea. Mouth/Throat: Mucous membranes are moist. Neck: No stridor.  No meningeal signs.   Cardiovascular: Normal rate, regular rhythm. Good peripheral circulation. Grossly normal heart sounds. Respiratory: Normal respiratory effort.  No retractions. Lungs CTAB. Gastrointestinal: Generalized  tenderness to palpation throughout the lower abdomen without any specific localized tenderness.  Soft, no peritonitis but moderate to severe tenderness. Musculoskeletal: No lower extremity tenderness nor edema. No gross deformities of extremities. Neurologic:  Normal speech and language. No gross focal neurologic deficits are appreciated.  Skin:  Skin is warm, dry and intact. No rash noted. Psychiatric: Mood and affect are normal. Speech and behavior are normal.  ____________________________________________   LABS (all labs ordered are listed, but only abnormal results are displayed)  Labs Reviewed  COMPREHENSIVE METABOLIC PANEL - Abnormal; Notable for the following components:      Result Value   Glucose, Bld 137 (*)    All other components within normal limits  CBC - Abnormal; Notable for the following components:   WBC 14.2 (*)    All other components within normal limits  URINALYSIS, COMPLETE (UACMP) WITH MICROSCOPIC - Abnormal; Notable for the following  components:   Color, Urine YELLOW (*)    APPearance CLEAR (*)    Protein, ur 30 (*)    All other components within normal limits  LIPASE, BLOOD  LACTIC ACID, PLASMA   ____________________________________________  EKG  None - EKG not ordered by ED physician ____________________________________________  RADIOLOGY   Ct Abdomen Pelvis W Contrast  Result Date: 03/27/2017 CLINICAL DATA:  Lower abdominal pain. EXAM: CT ABDOMEN AND PELVIS WITH CONTRAST TECHNIQUE: Multidetector CT imaging of the abdomen and pelvis was performed using the standard protocol following bolus administration of intravenous contrast. CONTRAST:  ISOVUE-300 IOPAMIDOL (ISOVUE-300) INJECTION 61% COMPARISON:  CT 01/01/2017 FINDINGS: Lower chest: Lung bases are clear. Hepatobiliary: The liver is enlarged. Subcentimeter hepatic hypodensity in the right lobe is unchanged from prior. Clips in the gallbladder fossa postcholecystectomy. Common bile duct  prominence level level mm, likely secondary to prior cholecystectomy, unchanged from prior. Pancreas: No ductal dilatation or inflammation. Spleen: Normal in size without focal abnormality. Tiny splenule anteriorly. Adrenals/Urinary Tract: Normal adrenal glands. No hydronephrosis or perinephric edema. Homogeneous renal enhancement. Urinary bladder is partially distended without wall thickening. Stomach/Bowel: Inflamed diverticulum in the mid sigmoid colon with peri-diverticular stranding and small amount of free fluid. Associated colonic wall thickening. Minimal intramural fluid without well-defined intramural or extra colonic abscess. No free air perforation. Multiple additional noninflamed diverticula in the mid transverse colon distally. Normal appendix. Mild fecalization of distal small bowel contents suggesting slow transit/reactive ileus. Small duodenal diverticulum without inflammation. The stomach is distended with ingested contents. Vascular/Lymphatic: Circumaortic left renal vein. Normal caliber abdominal aorta. Small retroperitoneal and periportal nodes. Reproductive: Prostate is unremarkable. Other: Small amount of free fluid related to diverticular inflammation tracks into the pelvis. No free air. No abscess. Fat within both inguinal canals. Musculoskeletal: There are no acute or suspicious osseous abnormalities. IMPRESSION: Acute sigmoid diverticulitis. No perforation or extraluminal abscess. Associated colonic wall thickening with some intramural fluid, but no definite intramural abscess. Electronically Signed   By: Rubye Oaks M.D.   On: 03/27/2017 04:30    ____________________________________________   PROCEDURES  Critical Care performed: No   Procedure(s) performed:   Procedures   ____________________________________________   INITIAL IMPRESSION / ASSESSMENT AND PLAN / ED COURSE  As part of my medical decision making, I reviewed the following data within the electronic  MEDICAL RECORD NUMBER Nursing notes reviewed and incorporated, Labs reviewed , Old chart reviewed, Discussed with radiologist, Notes from prior ED visits and [place] Controlled Substance Database    Differential diagnosis includes, but is not limited to, acute appendicitis, renal colic, testicular torsion, urinary tract infection/pyelonephritis, prostatitis,  epididymitis, diverticulitis, small bowel obstruction or ileus, colitis, abdominal aortic aneurysm, gastroenteritis, hernia, etc. his vital signs are notable for a mild tachycardia and while his metabolic panel is normal, he does have a mild leukocytosis.  Given his history of recurrent diverticulitis, that is the top item on my differential.  I feel that he would benefit from a CT scan.  Given the acuity of his symptoms, his obesity which will actually help with the imaging, and his discomfort which I believe would make it difficult for him to tolerate oral contrast, I will proceed with IV contrast only CT scan.  I have asked the nurse to place an IV and give him a liter of fluids as well and check a lactic acid.  Morphine and Zofran for symptomatic control.  The patient agrees with the plan.   Clinical Course as of Mar 27 640  [facility]  Mar 27, 2017  [id number] Lactic Acid, Venous: 1.3 [CF]  0434 Acute sigmoid diverticulitis but without any evidence of abscess or perforation.  Called and discussed the case by phone with Dr. Manus GunningEhinger the radiologist given the patient's complicated history.  Will give first dose of antibiotics by IV for aggressive initial treatment and then treat with Augmentin as per new outpatient antibiotic recommendations (in an attempt to avoid Cipro when possible).  Will reassess patient and update him on the plan.  If pain is uncontrolled or patient cannot tolerate PO, will evaluate for admission. CT Abdomen Pelvis W Contrast [CF]  0501 Patient's pain is much improved and was sleeping when I checked on him.  No longer tachycardiac.  Agreed with the  plan for IV antibiotics initially but plans for outpatient course.  Explained to the patient that he is at significant risk for developing complications given his history, in spite of aggressive treatment, so gave strict return precautions if he is worsening or fails to improve.  He understands and agrees with the plan.  [CF]  0510 Patient now reporting more pain.  Awaiting antibiotics from pharmacy.  Will give another dose of morphine as well as a dose of Toradol 15 mg IV.  If pain uncontrolled after treatment, will need to consider inpatient treatment.  [CF]  [id number] I reviewed the patient's prescription history over the last 24 months in the multi-state controlled substances database(s) that includes [place], [place], [place], [place], [place], [place], [place], [place], [place], [place], [place], [place], [place], and [place].  Results were notable for regular prescriptions of phentermine but no narcotics for about a year and no pattern concerning for possible abuse.  [CF]    Clinical Course User Index [CF] Loleta RoseForbach, Ashby Leflore, MD    ____________________________________________  FINAL CLINICAL IMPRESSION(S) / ED DIAGNOSES  Final diagnoses:  Diverticulitis of sigmoid colon     MEDICATIONS GIVEN DURING THIS VISIT:  Medications  sodium chloride 0.9 % bolus 1,000 mL (0 mLs Intravenous Stopped 03/27/17 0626)  morphine 4 MG/ML injection 4 mg (4 mg Intravenous Given 03/27/17 0349)  ondansetron (ZOFRAN) injection 4 mg (4 mg Intravenous Given 03/27/17 0349)  iopamidol (ISOVUE-300) 61 % injection 125 mL (125 mLs Intravenous Contrast Given 03/27/17 0403)  cefTRIAXone (ROCEPHIN) 2 g in dextrose 5 % 50 mL IVPB (0 g Intravenous Stopped 03/27/17 0545)    And  metroNIDAZOLE (FLAGYL) IVPB 500 mg (0 mg Intravenous Stopped 03/27/17 0639)  morphine 4 MG/ML injection 4 mg (4 mg Intravenous Given 03/27/17 0548)  ketorolac (TORADOL) 30 MG/ML injection 15 mg (15 mg  Intravenous Given 03/27/17 0548)     ED Discharge Orders        Ordered    docusate sodium (COLACE) 100 MG capsule     03/27/17 0508    amoxicillin-clavulanate (AUGMENTIN) 875-125 MG tablet  Every 8 hours     03/27/17 0508    ondansetron (ZOFRAN ODT) 4 MG disintegrating tablet  Status:  Discontinued     03/27/17 0508    oxyCODONE-acetaminophen (ROXICET) 5-325 MG tablet  Every 6 hours PRN,   Status:  Discontinued     03/27/17 0508    ondansetron (ZOFRAN) 4 MG tablet     03/27/17 0513    oxyCODONE-acetaminophen (ROXICET) 5-325 MG tablet  Every 6 hours PRN     03/27/17 0513       Note:  This document was prepared using Dragon voice recognition software and may include unintentional dictation errors.  Loleta RoseForbach, Younis Mathey, MD 03/27/17 [phone]

## 2018-01-31 ENCOUNTER — Emergency Department
Admission: EM | Admit: 2018-01-31 | Discharge: 2018-01-31 | Disposition: A | Discharge status: Home / Self Care | Payer: Self-pay | Attending: Emergency Medicine | Admitting: Emergency Medicine

## 2018-01-31 ENCOUNTER — Encounter: Payer: Self-pay | Admitting: Emergency Medicine

## 2018-01-31 DIAGNOSIS — I10 Essential (primary) hypertension: Secondary | ICD-10-CM | POA: Insufficient documentation

## 2018-01-31 DIAGNOSIS — F17210 Nicotine dependence, cigarettes, uncomplicated: Secondary | ICD-10-CM | POA: Insufficient documentation

## 2018-01-31 DIAGNOSIS — J45909 Unspecified asthma, uncomplicated: Secondary | ICD-10-CM | POA: Insufficient documentation

## 2018-01-31 DIAGNOSIS — Z79899 Other long term (current) drug therapy: Secondary | ICD-10-CM | POA: Insufficient documentation

## 2018-01-31 DIAGNOSIS — J069 Acute upper respiratory infection, unspecified: Secondary | ICD-10-CM | POA: Insufficient documentation

## 2018-01-31 DIAGNOSIS — B9789 Other viral agents as the cause of diseases classified elsewhere: Secondary | ICD-10-CM

## 2018-01-31 MED ORDER — PROMETHAZINE-DM 6.25-15 MG/5ML PO SYRP: 5.0000 mL | ORAL_SOLUTION | Freq: Every evening | ORAL | 0 refills | Status: DC | PRN

## 2018-01-31 NOTE — ED Triage Notes (Signed)
Patient presents to the ED with a cough x 1 week.  Patient reports coughing up yellow/green mucous.  Patient states his cough is keeping him up at night.  Patient states, "I was going to try to wait until Monday to see my doctor, but then I am up here already with my step son so I thought I would go ahead and get it checked out."  Patient also reports occasional generalized body aches.

## 2018-01-31 NOTE — ED Notes (Signed)
Pt reports a cough   - symptoms started 8 days ago  Cough  Is  Worse productive  At times lungs  Are  Clear  Pt speaking in clear  sentances  Pt is a  Smoker

## 2018-01-31 NOTE — Discharge Instructions (Addendum)
You have been diagnosed with a viral URI with cough. Take Mucinex 600 mg every 12 hours for the next 3 days. I have given you a RX for Promethazine DM cough syrup. This may cause sedation. Follow up with your PCP as needed if symptoms persist or worsen.

## 2018-01-31 NOTE — ED Provider Notes (Signed)
[facility] Emergency Department Provider Note ____________________________________________  Time seen: 1800  I have reviewed the triage vital signs and the nursing notes.  HISTORY  Chief Complaint  Cough   HPI Anthony [name] is a 37 y.o. male who presents to the clinic today with c/o a cough. He reports this started 1 week ago. The cough is productive of yellow/green mucous. He denies runny nose, nasal congestion, ear pain or sore throat. He reports some wheezing and shortness of breath. He denies chest pain. He denies nausea, vomiting or diarrhea. He denies fever, chills or body aches. He has tried Mucinex and Theraflu with minimal relief. He has a history of asthma but does not use an Albuterol inhaler. He has had sick contacts. He does smoke.  Past Medical History:  Diagnosis Date  . Arthritis   . Asthma   . Diverticulitis   . High cholesterol   . Hypertension     There are no active problems to display for this patient.   Past Surgical History:  Procedure Laterality Date  . CHOLECYSTECTOMY    . SHOULDER ARTHROSCOPY      Prior to Admission medications   Medication Sig Start Date End Date Taking? Authorizing Provider  diclofenac (VOLTAREN) 75 MG EC tablet Take 1 tablet by mouth 2 (two) times daily. 01/02/17   [provider]  docusate sodium (COLACE) 100 MG capsule Take 1 tablet once or twice daily as needed for constipation while taking narcotic pain medicine 03/27/17   Loleta Rose, MD  HYDROCHLOROTHIAZIDE PO Take 1 tablet by mouth daily.    [provider]  lisinopril (PRINIVIL,ZESTRIL) 20 MG tablet Take 20 mg by mouth daily.    [provider]  meloxicam (MOBIC) 15 MG tablet Take 1 tablet (15 mg total) by mouth daily. Patient not taking: Reported on 03/27/2017 12/26/16   Joni Reining, PA-C  ondansetron [facility]) 4 MG tablet Take 1-2 tabs by mouth every 8 hours as needed for nausea/vomiting 03/27/17   Loleta Rose,  MD  oxyCODONE-acetaminophen (ROXICET) 5-325 MG tablet Take 1-2 tablets by mouth every 6 (six) hours as needed for severe pain. 03/27/17   Loleta Rose, MD  pantoprazole (PROTONIX) 40 MG tablet Take 40 mg by mouth daily.    [provider]  phentermine 37.5 MG capsule Take 37.5 mg by mouth every morning.    [provider]  promethazine (PHENERGAN) 12.5 MG tablet Take 1 tablet (12.5 mg total) by mouth every 6 (six) hours as needed for nausea or vomiting. Patient not taking: Reported on 03/27/2017 01/01/17   Willy Eddy, MD  promethazine-dextromethorphan (PROMETHAZINE-DM) 6.25-15 MG/5ML syrup Take 5 mLs by mouth at bedtime as needed for cough. 01/31/18   Lorre Munroe, NP  tamsulosin (FLOMAX) 0.4 MG CAPS capsule Take 1 capsule (0.4 mg total) by mouth daily after supper. Patient not taking: Reported on 03/27/2017 01/01/17   Willy Eddy, MD    Allergies Hydrocodone  No family history on file.  Social History Social History   Tobacco Use  . Smoking status: Current Every Day Smoker    Packs/day: 1.00    Types: Cigarettes  . Smokeless tobacco: Never Used  Substance Use Topics  . Alcohol use: No  . Drug use: No    Review of Systems  Constitutional: Negative for fever, chills or body aches. ENT: Negative for runny nose, nasal congestion, ear pain orsore throat. Cardiovascular: Negative for chest pain. Respiratory: Positive for cough, wheezing and shortness of breath.  Gastrointestinal: Negative for abdominal pain, nausea, vomiting and diarrhea. ____________________________________________  PHYSICAL EXAM:  VITAL SIGNS: ED Triage Vitals  Enc Vitals Group     BP 01/31/18 1746 (!) 164/93     Pulse Rate 01/31/18 1746 96     Resp 01/31/18 1746 18     Temp 01/31/18 1746 98.4 F (36.9 C)     Temp Source 01/31/18 1746 Oral     SpO2 01/31/18 1746 98 %     Weight 01/31/18 1747 300 lb (136.1 kg)     Height 01/31/18 1747 5\' 11"  (1.803 m)     Head Circumference  --      Peak Flow --      Pain Score 01/31/18 1746 5     Pain Loc --      Pain Edu? --      Excl. in GC? --     Constitutional: Alert and oriented. Well appearing and in no distress. Ears: Canals clear. TMs intact bilaterally. Nose: No congestion/rhinorrhea/epistaxis. Mouth/Throat: Mucous membranes are moist.megaly. Hematological/Lymphatic/Immunological: No cervical lymphadenopathy. Cardiovascular: Normal rate, regular rhythm.  Respiratory: Normal respiratory effort. No wheezes/rales/rhonchi. ____________________________________________  INITIAL IMPRESSION / ASSESSMENT AND PLAN / ED COURSE  Cough:  DDX include allergic rhinitis, viral URI with cough, bronchitis, pneumonia Exam benign, he declines xray Continue Mucinex 600 mg Q12H prn RX provided for Promethazine DM 5 ml's QHS prn for cough   I reviewed the patient's prescription history over the last 12 months in the multi-state controlled substances database(s) that includes [place], [place], [place], [place], [place], [place], [place], [place], [place], [place], [place], [place], [place], and [place].  Results were notable for no controlled substances filled in the last 6 months ____________________________________________  FINAL CLINICAL IMPRESSION(S) / ED DIAGNOSES  Final diagnoses:  Viral URI with cough      Lorre Munroe, NP 01/31/18 1815    Rockne Menghini, MD 01/31/18 [phone]

## 2018-05-29 ENCOUNTER — Other Ambulatory Visit: Payer: Self-pay

## 2018-05-29 ENCOUNTER — Emergency Department
Admission: EM | Admit: 2018-05-29 | Discharge: 2018-05-29 | Disposition: A | Discharge status: Home / Self Care | Payer: No Typology Code available for payment source | Attending: Emergency Medicine | Admitting: Emergency Medicine

## 2018-05-29 ENCOUNTER — Emergency Department: Payer: No Typology Code available for payment source

## 2018-05-29 ENCOUNTER — Encounter: Payer: Self-pay | Admitting: Intensive Care

## 2018-05-29 DIAGNOSIS — Z79899 Other long term (current) drug therapy: Secondary | ICD-10-CM | POA: Insufficient documentation

## 2018-05-29 DIAGNOSIS — F17210 Nicotine dependence, cigarettes, uncomplicated: Secondary | ICD-10-CM | POA: Insufficient documentation

## 2018-05-29 DIAGNOSIS — J45909 Unspecified asthma, uncomplicated: Secondary | ICD-10-CM | POA: Insufficient documentation

## 2018-05-29 DIAGNOSIS — I10 Essential (primary) hypertension: Secondary | ICD-10-CM | POA: Diagnosis not present

## 2018-05-29 DIAGNOSIS — M7918 Myalgia, other site: Secondary | ICD-10-CM | POA: Diagnosis present

## 2018-05-29 MED ORDER — TRAMADOL HCL 50 MG PO TABS: 50.0000 mg | ORAL_TABLET | Freq: Two times a day (BID) | ORAL | 0 refills | Status: DC | PRN

## 2018-05-29 MED ORDER — CYCLOBENZAPRINE HCL 10 MG PO TABS: 10.0000 mg | ORAL_TABLET | Freq: Three times a day (TID) | ORAL | 0 refills | Status: DC | PRN

## 2018-05-29 MED ORDER — CYCLOBENZAPRINE HCL 10 MG PO TABS
10.0000 mg | ORAL_TABLET | Freq: Once | ORAL | Status: DC
  Filled 2018-05-29: qty 1

## 2018-05-29 MED ORDER — TRAMADOL HCL 50 MG PO TABS
50.0000 mg | ORAL_TABLET | Freq: Once | ORAL | Status: DC
  Filled 2018-05-29: qty 1
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
## 2018-05-29 NOTE — ED Notes (Signed)
Ron PA-C notified pt did not want to take meds due to possible side effect of drowsiness despite having a ride home.
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
## 2018-11-01 IMAGING — CR DG KNEE COMPLETE 4+V*R*
1 series · 4 of 4 positions shown · non-contrast
Comparison: Radiographs October 18, 2012.

CLINICAL DATA: Right knee pain for 2 months without new injury.

EXAM:
RIGHT KNEE - COMPLETE 4+ VIEW

[Series 1: dg knee complete 4 views right · 0.14mm/px · 4 of 4 slices shown]
[im 1/4]
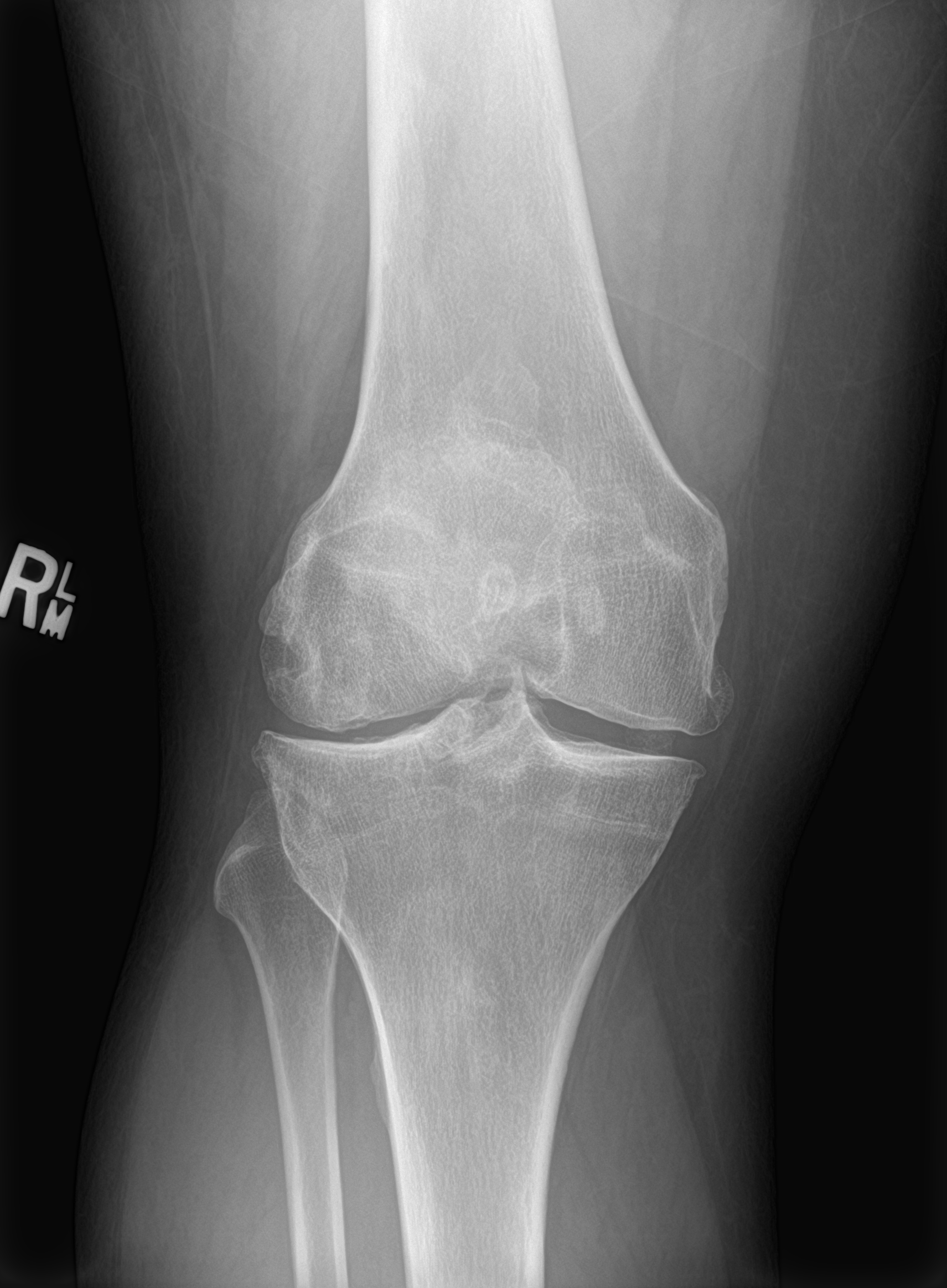
[im 2/4]
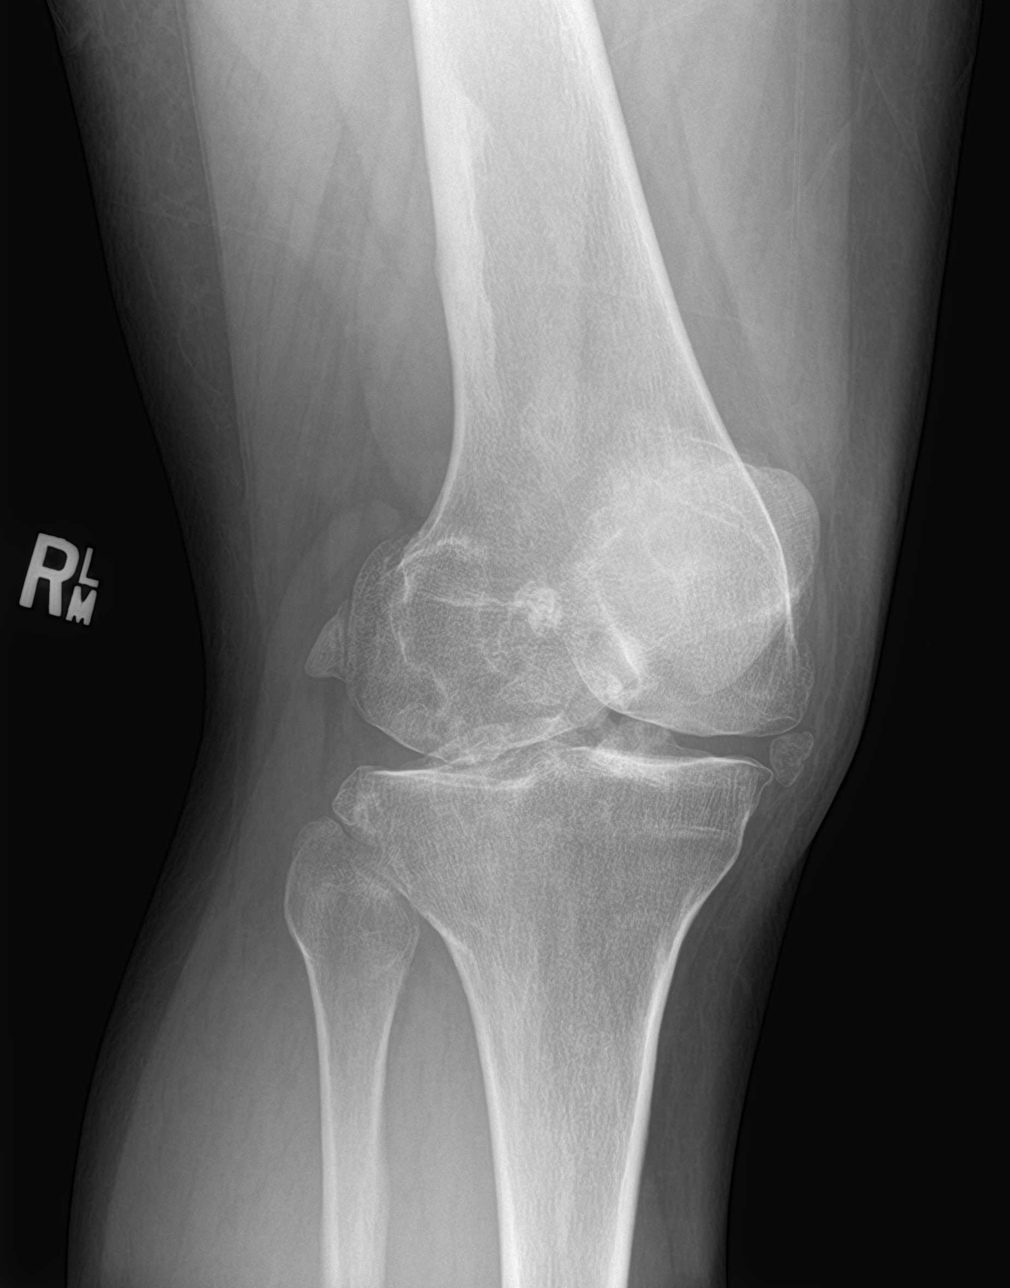
[im 3/4]
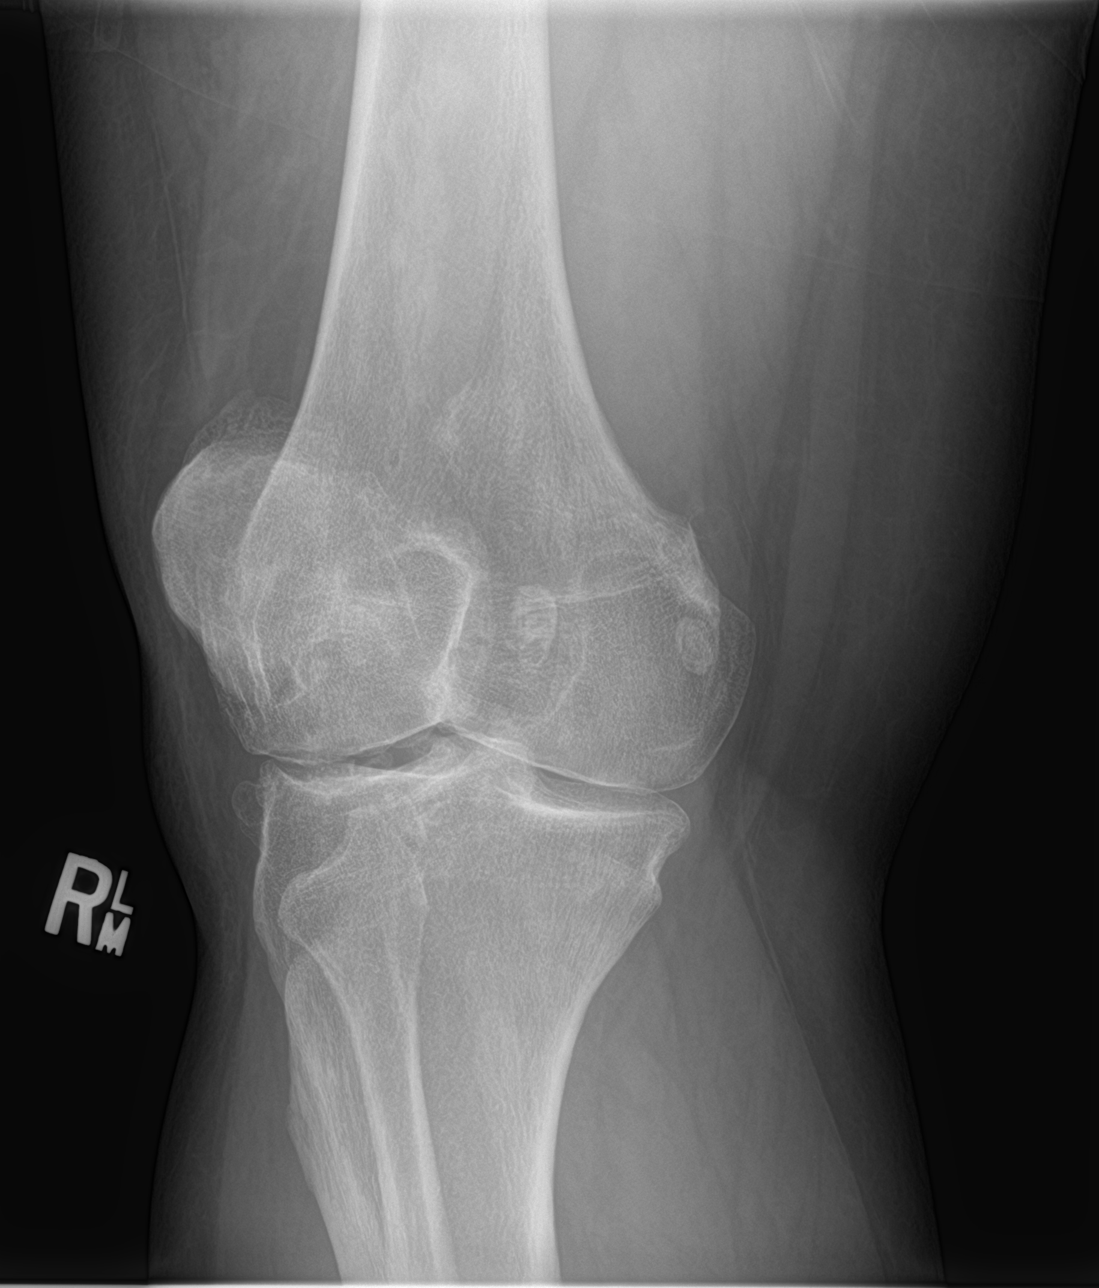
[im 4/4]
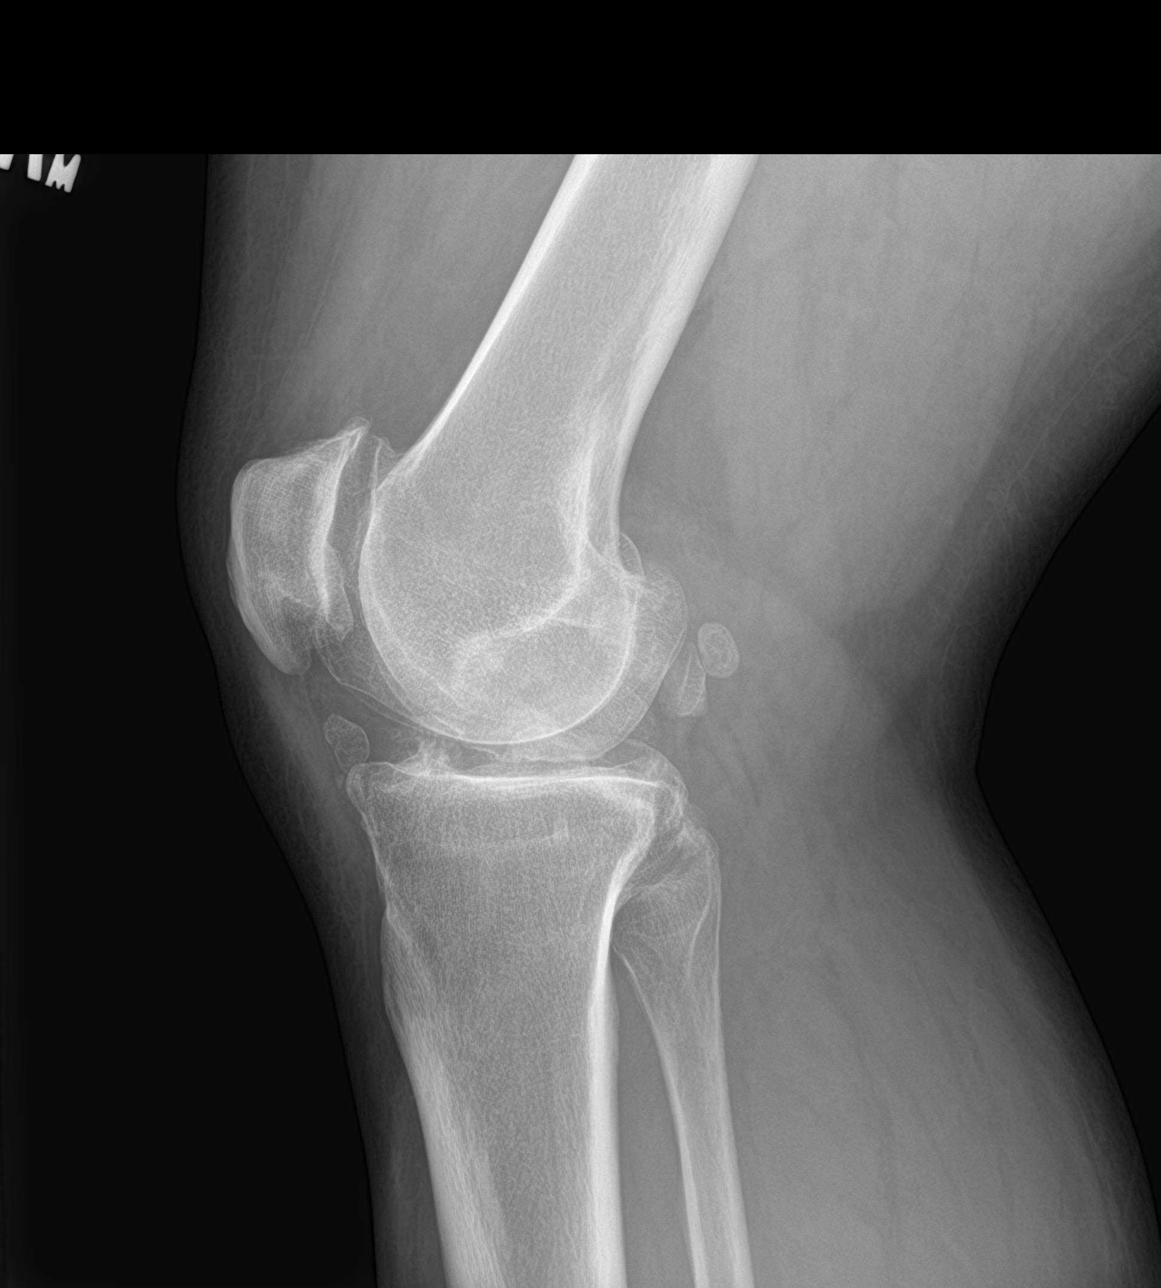

[4 of 4 positions shown; findings below may reference images not displayed]

FINDINGS: No evidence of fracture, dislocation, or joint effusion. Moderate
narrowing of lateral joint space is noted. Mild medial narrowing is
noted with osteophyte formation. Calcified loose bodies are again
noted anteriorly and posteriorly. Mild narrowing of patellofemoral
space is noted with osteophyte formation. Soft tissues are
unremarkable.
IMPRESSION: Moderate degenerative joint disease. No acute abnormality seen in
the right knee.

## 2018-11-07 IMAGING — CT CT RENAL STONE PROTOCOL
2 of 4 series · 16 of 46 positions shown, 18 images · non-contrast
Comparison: CT scan of October 08, 2015.

CLINICAL DATA: Acute left lower quadrant abdominal pain.

EXAM:
CT ABDOMEN AND PELVIS WITHOUT CONTRAST
TECHNIQUE: Multidetector CT imaging of the abdomen and pelvis was performed
following the standard protocol without IV contrast.

[Series 2: stone full standard · axial · 0.92mm/px · z∈[+19,+504]mm · 13 of 107 slices shown, 15 images]
[im 5/107  soft-tissue]
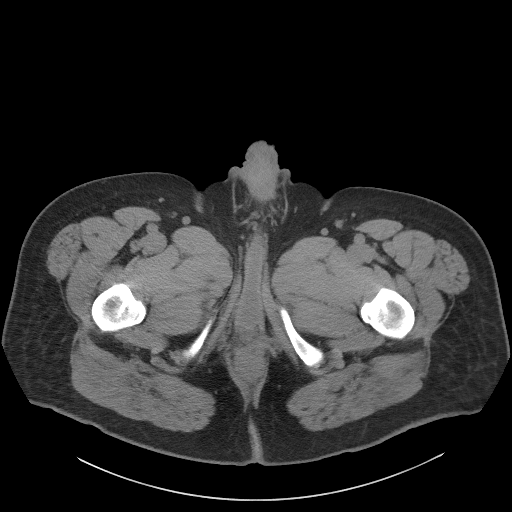
[im 5/107  bone]
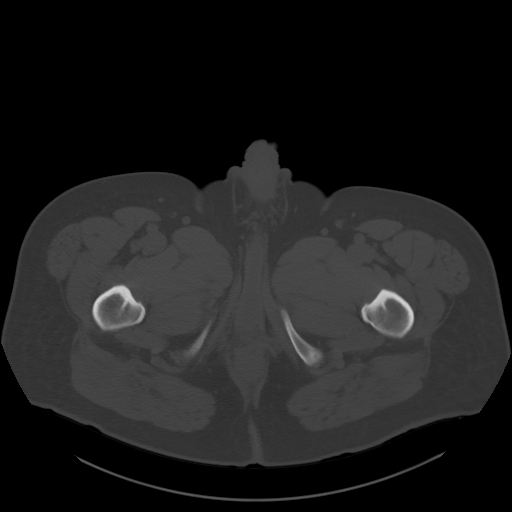
[im 13/107  soft-tissue]
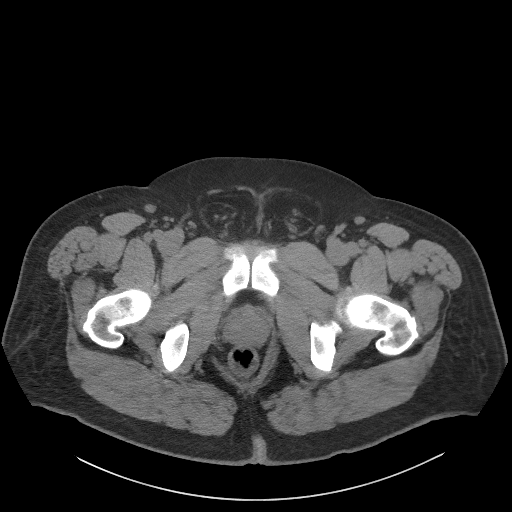
[im 21/107  soft-tissue]
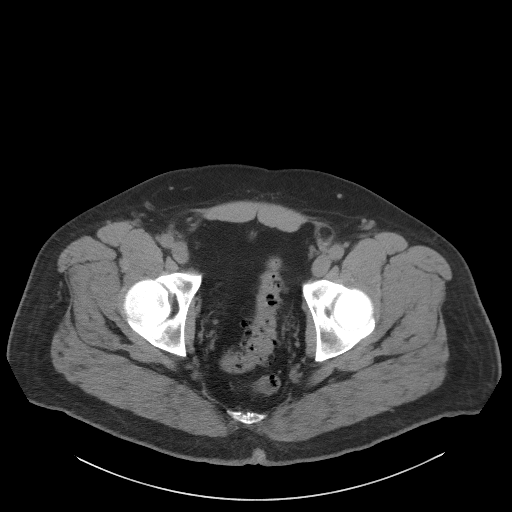
[im 29/107  soft-tissue]
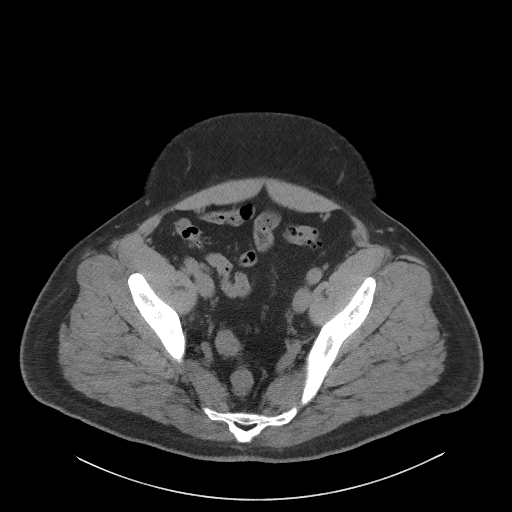
[im 37/107  soft-tissue]
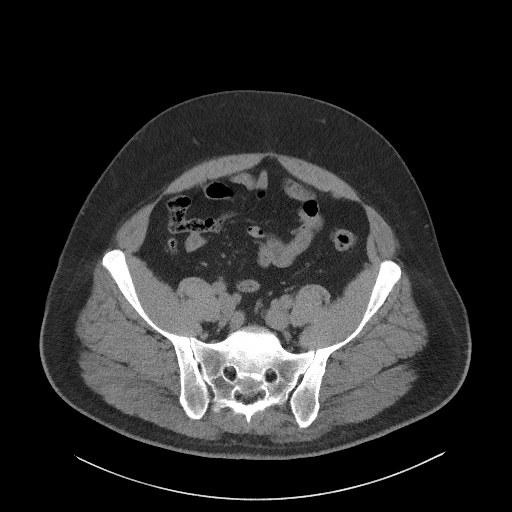
[im 45/107  soft-tissue]
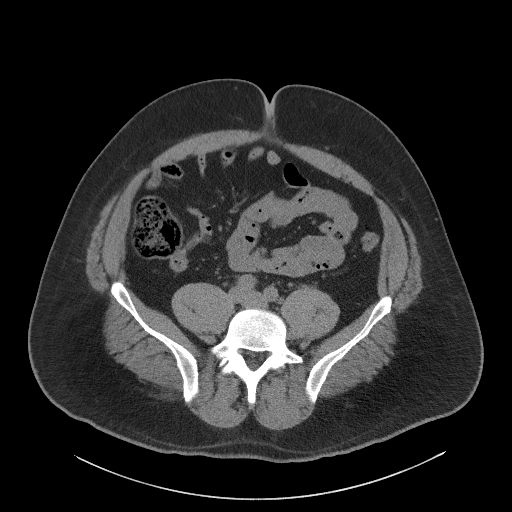
[im 54/107  soft-tissue]
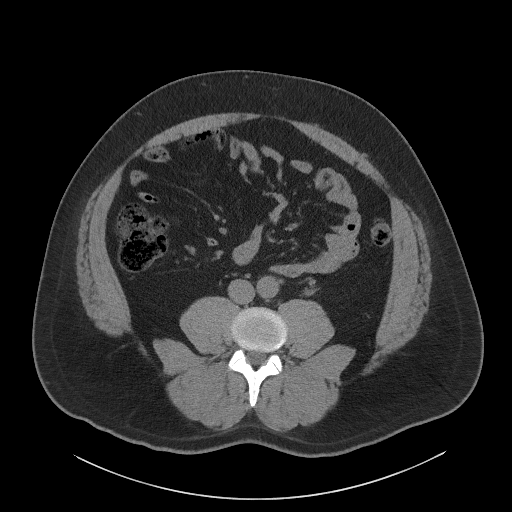
[im 62/107  soft-tissue]
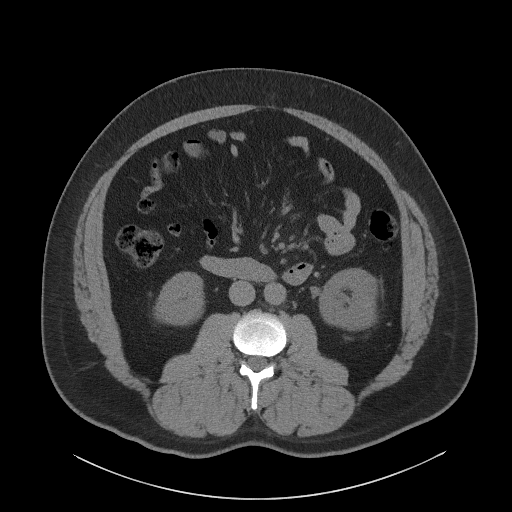
[im 70/107  soft-tissue]
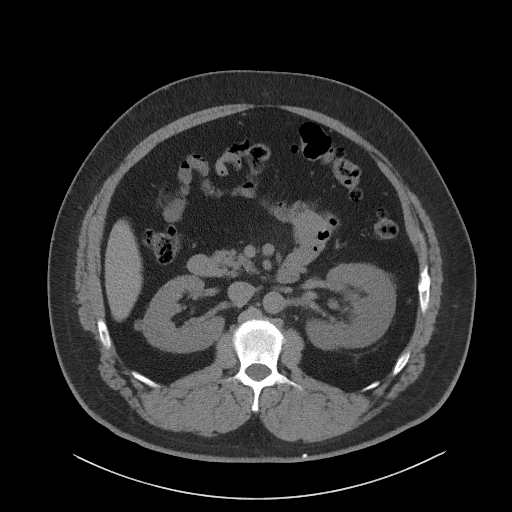
[im 70/107  bone]
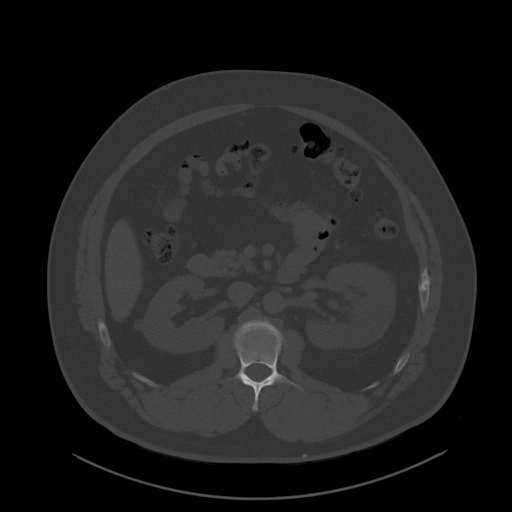
[im 78/107  soft-tissue]
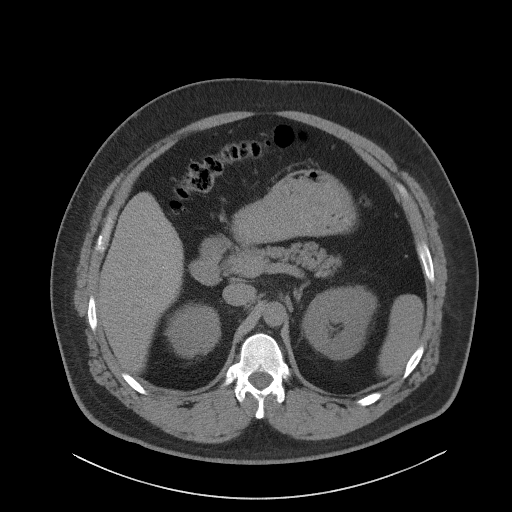
[im 86/107  soft-tissue]
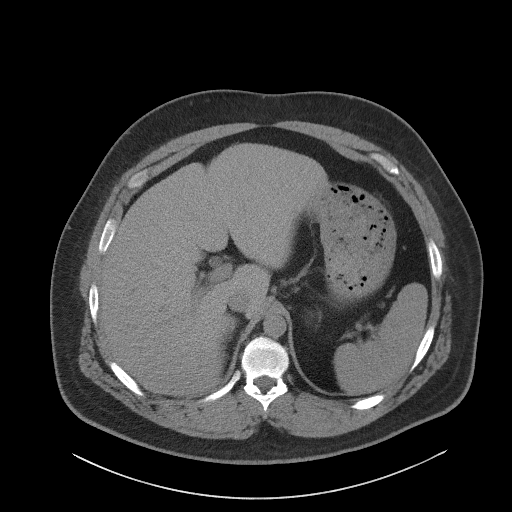
[im 94/107  soft-tissue]
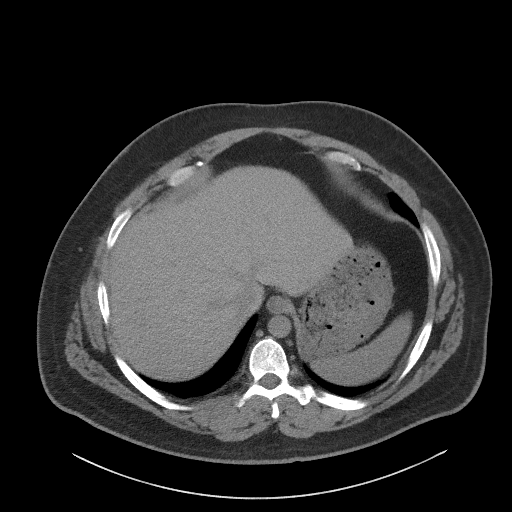
[im 102/107  soft-tissue]
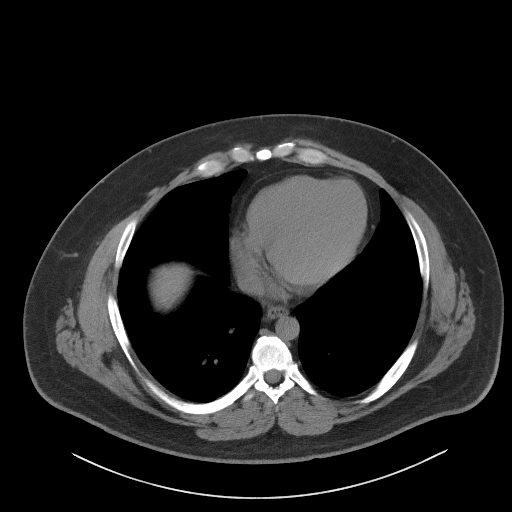

[Series 5: coronal · coronal · 0.89mm/px · 3 of 173 slices shown]
[im 58/173  soft-tissue]
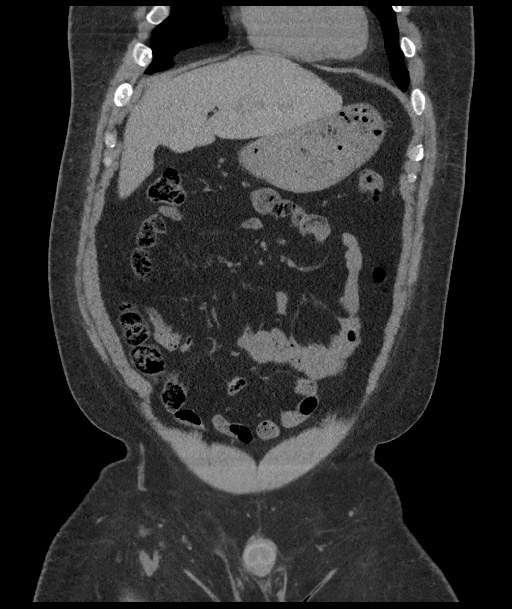
[im 77/173  soft-tissue]
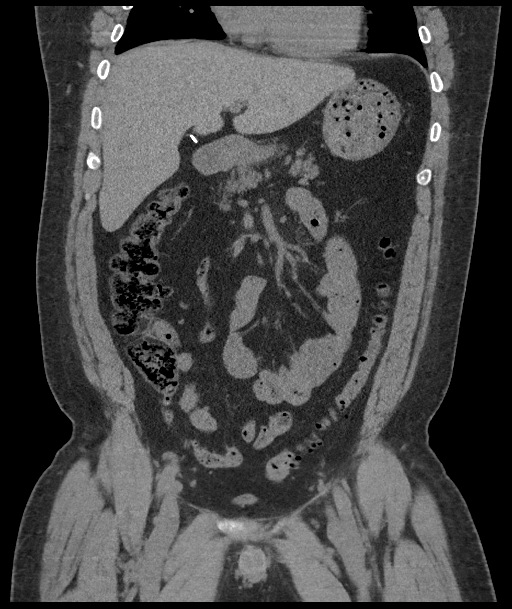
[im 96/173  soft-tissue]
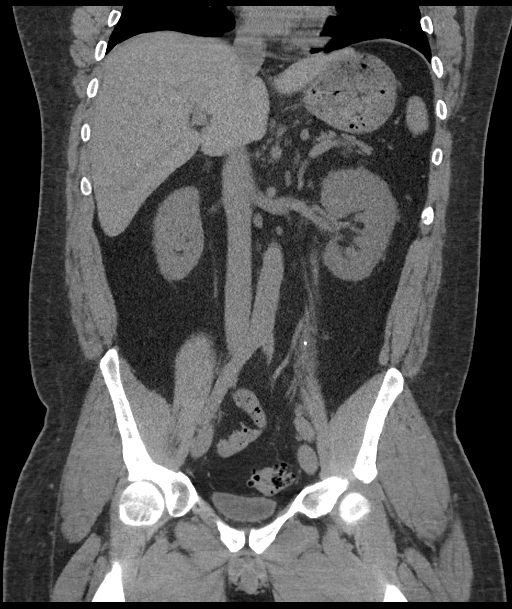

[16 of 46 positions shown; findings below may reference images not displayed]

FINDINGS: Lower chest: No acute abnormality.

Hepatobiliary: No focal liver abnormality is seen. Status post
cholecystectomy. No biliary dilatation.

Pancreas: Unremarkable. No pancreatic ductal dilatation or
surrounding inflammatory changes.

Spleen: Normal in size without focal abnormality.

Adrenals/Urinary Tract: Adrenal glands appear normal. Right kidney
and ureter appear normal. Urinary bladder is unremarkable. Minimal
left hydronephrosis is noted secondary to 4 mm calculus in
midportion of left ureter.

Stomach/Bowel: Stomach is within normal limits. Appendix appears
normal. No evidence of bowel wall thickening, distention, or
inflammatory changes. Sigmoid diverticulosis is noted.

Vascular/Lymphatic: No significant vascular findings are present. No
enlarged abdominal or pelvic lymph nodes.

Reproductive: Prostate is unremarkable.

Other: No abdominal wall hernia or abnormality. No abdominopelvic
ascites.

Musculoskeletal: No acute or significant osseous findings.
IMPRESSION: Minimal left hydronephrosis is noted secondary to 4 mm calculus in
midportion of left ureter.

Sigmoid diverticulosis is noted without inflammation.

## 2018-11-18 ENCOUNTER — Other Ambulatory Visit: Payer: Self-pay

## 2018-11-18 ENCOUNTER — Emergency Department: Payer: Medicaid Other

## 2018-11-18 ENCOUNTER — Emergency Department
Admission: EM | Admit: 2018-11-18 | Discharge: 2018-11-18 | Disposition: A | Discharge status: Home / Self Care | Payer: Medicaid Other | Attending: Emergency Medicine | Admitting: Emergency Medicine

## 2018-11-18 DIAGNOSIS — M7121 Synovial cyst of popliteal space [Baker], right knee: Secondary | ICD-10-CM | POA: Insufficient documentation

## 2018-11-18 DIAGNOSIS — J45909 Unspecified asthma, uncomplicated: Secondary | ICD-10-CM | POA: Diagnosis not present

## 2018-11-18 DIAGNOSIS — F17210 Nicotine dependence, cigarettes, uncomplicated: Secondary | ICD-10-CM | POA: Diagnosis not present

## 2018-11-18 DIAGNOSIS — I10 Essential (primary) hypertension: Secondary | ICD-10-CM | POA: Diagnosis not present

## 2018-11-18 DIAGNOSIS — M25561 Pain in right knee: Secondary | ICD-10-CM | POA: Diagnosis present

## 2018-11-18 DIAGNOSIS — Z79899 Other long term (current) drug therapy: Secondary | ICD-10-CM | POA: Insufficient documentation

## 2018-11-18 MED ORDER — OXYCODONE-ACETAMINOPHEN 7.5-325 MG PO TABS: 1.0000 | ORAL_TABLET | Freq: Four times a day (QID) | ORAL | 0 refills | Status: DC | PRN

## 2018-11-18 NOTE — Discharge Instructions (Signed)
Follow-up with treating orthopedic for definitive evaluation and treatment.

## 2018-11-18 NOTE — ED Notes (Signed)
See triage note  Presents with pain to posterior right knee  States he was trying to lift a lawn mower onto his truck  And felt a "pop" behind knee  Ambulates with limp d/t pain   Min swelling noted

## 2018-11-18 NOTE — ED Triage Notes (Signed)
Pt states he reinjured his right knee while loading a riding mower on the back of his truck.

## 2018-11-18 NOTE — ED Provider Notes (Signed)
[facility] Emergency Department Provider Note   ____________________________________________   First MD Initiated Contact with Patient 11/18/18 [phone]     (approximate)  I have reviewed the triage vital signs and the nursing notes.   HISTORY  Chief Complaint Knee Pain    HPI Anthony [name] is a 38 y.o. male patient complaining of acute onset right knee pain secondary to lifting incident yesterday.  Patient that he was trying to lift a riding lawnmower onto the back of his truck.  Patient stated pain is been more intense in the popliteal area of the right knee.  Patient rates pain as a 9/10.  Patient described pain is "achy".  Patient denies loss of function or loss of sensation.  No palliative measures for complaint.  Patient has a history of tricompartmental arthritis of the knees.  Patient state he is not a candidate for knee replacement secondary to his age.         Past Medical History:  Diagnosis Date  . Arthritis   . Asthma   . Diverticulitis   . Hypertension     There are no active problems to display for this patient.   Past Surgical History:  Procedure Laterality Date  . CHOLECYSTECTOMY    . SHOULDER ARTHROSCOPY      Prior to Admission medications   Medication Sig Start Date End Date Taking? Authorizing Provider  diclofenac (VOLTAREN) 75 MG EC tablet Take 1 tablet by mouth 2 (two) times daily. 01/02/17   [provider]  docusate sodium (COLACE) 100 MG capsule Take 1 tablet once or twice daily as needed for constipation while taking narcotic pain medicine 03/27/17   Hinda Kehr, MD  lisinopril (PRINIVIL,ZESTRIL) 20 MG tablet Take 40 mg by mouth daily.     [provider]  oxyCODONE-acetaminophen (PERCOCET) 7.5-325 MG tablet Take 1 tablet by mouth every 6 (six) hours as needed. 11/18/18   Sable Feil, PA-C  pantoprazole (PROTONIX) 40 MG tablet Take 40 mg by mouth daily.    [provider]  phentermine  37.5 MG capsule Take 37.5 mg by mouth every morning.    [provider]  promethazine (PHENERGAN) 12.5 MG tablet Take 1 tablet (12.5 mg total) by mouth every 6 (six) hours as needed for nausea or vomiting. Patient not taking: Reported on 03/27/2017 01/01/17   Merlyn Lot, MD    Allergies Patient has no active allergies.  No family history on file.  Social History Social History   Tobacco Use  . Smoking status: Current Every Day Smoker    Packs/day: 1.00    Types: Cigarettes  . Smokeless tobacco: Never Used  Substance Use Topics  . Alcohol use: No  . Drug use: No    Review of Systems Constitutional: No fever/chills Eyes: No visual changes. ENT: No sore throat. Cardiovascular: Denies chest pain. Respiratory: Denies shortness of breath. Gastrointestinal: No abdominal pain.  No nausea, no vomiting.  No diarrhea.  No constipation. Genitourinary: Negative for dysuria. Musculoskeletal: Right knee pain.   Skin: Negative for rash. Neurological: Negative for headaches, focal weakness or numbness. ____________________________________________   PHYSICAL EXAM:  VITAL SIGNS: ED Triage Vitals  Enc Vitals Group     BP 11/18/18 0733 (!) 163/100     Pulse Rate 11/18/18 0733 91     Resp 11/18/18 0733 18     Temp 11/18/18 0733 97.9 F (36.6 C)     Temp Source 11/18/18 0733 Oral     SpO2  11/18/18 0733 96 %     Weight 11/18/18 0727 (!) 305 lb (138.3 kg)     Height 11/18/18 0727 6' (1.829 m)     Head Circumference --      Peak Flow --      Pain Score 11/18/18 0726 9     Pain Loc --      Pain Edu? --      Excl. in GC? --    Constitutional: Alert and oriented. Well appearing and in no acute distress. Cardiovascular: Normal rate, regular rhythm. Grossly normal heart sounds.  Good peripheral circulation.  Elevated blood pressure. Respiratory: Normal respiratory effort.  No retractions. Lungs CTAB. Gastrointestinal: Soft and nontender. No distention. No abdominal bruits.  No CVA tenderness. Musculoskeletal: No obvious deformity to the right knee.  No obvious effusion.  Crepitus palpation anterior patella.  Marked guarding with palpation of the popliteal area of the right knee.  Neurologic:  Normal speech and language. No gross focal neurologic deficits are appreciated. No gait instability. Skin:  Skin is warm, dry and intact. No rash noted. Psychiatric: Mood and affect are normal. Speech and behavior are normal.  ____________________________________________   LABS (all labs ordered are listed, but only abnormal results are displayed)  Labs Reviewed - No data to display ____________________________________________  EKG   ____________________________________________  RADIOLOGY  ED MD interpretation:  Official radiology report(s): Dg Knee 2 Views Right  Result Date: 11/18/2018 CLINICAL DATA:  Chronic right knee pain which worsened yesterday when the patient put a lawn mower in his truck. Initial encounter. EXAM: RIGHT KNEE - 1-2 VIEW COMPARISON:  Plain films right knee 05/29/2018. FINDINGS: There is no acute bony or joint abnormality. Markedly advanced for age tricompartmental osteoarthritis is again seen. 1.3 cm loose body in a Baker's cyst is noted. 1 cm loose body in the medial compartment seen on the prior examination is difficult to visualize today. IMPRESSION: No acute finding. 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 Electronically Signed   By: Drusilla Kannerhomas  Dalessio M.D.   On: 11/18/2018 08:29    ____________________________________________   PROCEDURES  Procedure(s) performed (including Critical Care):  Procedures   ____________________________________________   INITIAL IMPRESSION / ASSESSMENT AND PLAN / ED COURSE  As part of my medical decision making, I reviewed the following data within the electronic MEDICAL RECORD NUMBER         Anthony [name] was evaluated in Emergency Department on 11/18/2018 for the symptoms  described in the history of present illness. He was evaluated in the context of the global COVID-19 pandemic, which necessitated consideration that the patient might be at risk for infection with the SARS-CoV-2 virus that causes COVID-19. Institutional protocols and algorithms that pertain to the evaluation of patients at risk for COVID-19 are in a state of rapid change based on information released by regulatory bodies including the CDC and federal and state organizations. These policies and algorithms were followed during the patient's care in the ED.   Patient presents with cute onset of right popliteal pain secondary to lifting bending incident.  Patient has history of tricompartmental arthritis.  Discussed x-ray findings with patient consistent with arthritis and Baker's cyst.  Patient given discharge care instruction advised follow-up with treating orthopedic for definitive evaluation and treatment.      ____________________________________________   FINAL CLINICAL IMPRESSION(S) / ED DIAGNOSES  Final diagnoses:  Acute pain of right knee  Bakers cyst, right     ED Discharge Orders  Ordered    oxyCODONE-acetaminophen (PERCOCET) 7.5-325 MG tablet  Every 6 hours PRN     11/18/18 0830           Note:  This document was prepared using Dragon voice recognition software and may include unintentional dictation errors.    Joni ReiningSmith, Paddy Neis K, PA-C 11/18/18 [id number]    Sharman CheekStafford, Phillip, MD 11/18/18 [phone]

## 2020-02-01 ENCOUNTER — Emergency Department
Admission: EM | Admit: 2020-02-01 | Discharge: 2020-02-01 | Disposition: A | Discharge status: Home / Self Care | Payer: Medicaid Other | Attending: Emergency Medicine | Admitting: Emergency Medicine

## 2020-02-01 ENCOUNTER — Other Ambulatory Visit: Payer: Self-pay

## 2020-02-01 ENCOUNTER — Emergency Department: Payer: Medicaid Other

## 2020-02-01 DIAGNOSIS — F17210 Nicotine dependence, cigarettes, uncomplicated: Secondary | ICD-10-CM | POA: Insufficient documentation

## 2020-02-01 DIAGNOSIS — X500XXA Overexertion from strenuous movement or load, initial encounter: Secondary | ICD-10-CM | POA: Insufficient documentation

## 2020-02-01 DIAGNOSIS — S40912A Unspecified superficial injury of left shoulder, initial encounter: Secondary | ICD-10-CM | POA: Diagnosis present

## 2020-02-01 DIAGNOSIS — Y93F2 Activity, caregiving, lifting: Secondary | ICD-10-CM | POA: Diagnosis not present

## 2020-02-01 DIAGNOSIS — Z79899 Other long term (current) drug therapy: Secondary | ICD-10-CM | POA: Insufficient documentation

## 2020-02-01 DIAGNOSIS — I10 Essential (primary) hypertension: Secondary | ICD-10-CM | POA: Insufficient documentation

## 2020-02-01 DIAGNOSIS — J45909 Unspecified asthma, uncomplicated: Secondary | ICD-10-CM | POA: Diagnosis not present

## 2020-02-01 DIAGNOSIS — S46912A Strain of unspecified muscle, fascia and tendon at shoulder and upper arm level, left arm, initial encounter: Secondary | ICD-10-CM

## 2020-02-01 MED ORDER — IBUPROFEN 600 MG PO TABS: 600.0000 mg | ORAL_TABLET | Freq: Three times a day (TID) | ORAL | 0 refills | Status: DC | PRN

## 2020-02-01 MED ORDER — OXYCODONE-ACETAMINOPHEN 7.5-325 MG PO TABS: 1.0000 | ORAL_TABLET | Freq: Four times a day (QID) | ORAL | 0 refills | Status: DC | PRN

## 2020-02-01 MED ORDER — LIDOCAINE 5 % EX PTCH
1.0000 | MEDICATED_PATCH | CUTANEOUS | Status: DC
  Administered 2020-02-01: 1 via TRANSDERMAL

## 2020-02-01 NOTE — ED Triage Notes (Signed)
Pt c/o left shoulder pain with movement since leaving the gym yesterday.

## 2020-02-01 NOTE — Discharge Instructions (Signed)
Follow discharge care instruction take medication as directed. °

## 2020-02-01 NOTE — ED Notes (Signed)
ED provider at bedside.

## 2020-02-01 NOTE — ED Provider Notes (Signed)
[name] Emergency Department Provider Note   ____________________________________________   First MD Initiated Contact with Patient 02/01/20 1016     (approximate)  I have reviewed the triage vital signs and the nursing notes.   HISTORY  Chief Complaint Shoulder Pain    HPI Anthony [name] is a 39 y.o. male patient complain of left shoulder pain secondary to lifting pulling incident.  Incident occurred yesterday.  Patient has decreased range of motion with overhead reaching.  Patient denies loss of sensation.  Patient is left-hand dominant.  Patient rates pain as a 7/10.  Patient scribed pain is "achy".  No palliative measure for complaint.         Past Medical History:  Diagnosis Date  . Arthritis   . Asthma   . Diverticulitis   . Hypertension     There are no problems to display for this patient.   Past Surgical History:  Procedure Laterality Date  . CHOLECYSTECTOMY    . SHOULDER ARTHROSCOPY      Prior to Admission medications   Medication Sig Start Date End Date Taking? Authorizing Provider  diclofenac (VOLTAREN) 75 MG EC tablet Take 1 tablet by mouth 2 (two) times daily. 01/02/17   [provider]  docusate sodium (COLACE) 100 MG capsule Take 1 tablet once or twice daily as needed for constipation while taking narcotic pain medicine 03/27/17   Loleta Rose, MD  ibuprofen (ADVIL) 600 MG tablet Take 1 tablet (600 mg total) by mouth every 8 (eight) hours as needed. 02/01/20   Joni Reining, PA-C  lisinopril (PRINIVIL,ZESTRIL) 20 MG tablet Take 40 mg by mouth daily.     [provider]  oxyCODONE-acetaminophen (PERCOCET) 7.5-325 MG tablet Take 1 tablet by mouth every 6 (six) hours as needed. 11/18/18   Joni Reining, PA-C  oxyCODONE-acetaminophen (PERCOCET) 7.5-325 MG tablet Take 1 tablet by mouth every 6 (six) hours as needed. 02/01/20   Joni Reining, PA-C  pantoprazole (PROTONIX) 40 MG tablet Take 40 mg by  mouth daily.    [provider]  phentermine 37.5 MG capsule Take 37.5 mg by mouth every morning.    [provider]  promethazine (PHENERGAN) 12.5 MG tablet Take 1 tablet (12.5 mg total) by mouth every 6 (six) hours as needed for nausea or vomiting. Patient not taking: Reported on 03/27/2017 01/01/17   Willy Eddy, MD    Allergies Patient has no known allergies.  No family history on file.  Social History Social History   Tobacco Use  . Smoking status: Current Every Day Smoker    Packs/day: 1.00    Types: Cigarettes  . Smokeless tobacco: Never Used  Substance Use Topics  . Alcohol use: No  . Drug use: No    Review of Systems  Constitutional: No fever/chills Eyes: No visual changes. ENT: No sore throat. Cardiovascular: Denies chest pain. Respiratory: Denies shortness of breath. Gastrointestinal: No abdominal pain.  No nausea, no vomiting.  No diarrhea.  No constipation. Genitourinary: Negative for dysuria. Musculoskeletal: Negative for back pain. Skin: Negative for rash. Neurological: Negative for headaches, focal weakness or numbness. Endocrine:  Hypertension.  ____________________________________________   PHYSICAL EXAM:  VITAL SIGNS: ED Triage Vitals  Enc Vitals Group     BP 02/01/20 0719 (!) 153/96     Pulse Rate 02/01/20 0719 88     Resp 02/01/20 0719 18     Temp 02/01/20 0719 98.9 F (37.2 C)     Temp Source  02/01/20 0719 Oral     SpO2 02/01/20 0719 96 %     Weight 02/01/20 0727 (!) 310 lb (140.6 kg)     Height 02/01/20 0727 5\' 11"  (1.803 m)     Head Circumference --      Peak Flow --      Pain Score 02/01/20 0727 7     Pain Loc --      Pain Edu? --      Excl. in GC? --     Constitutional: Alert and oriented. Well appearing and in no acute distress. Neck: No stridor.No cervical spine tenderness to palpation. Hematological/Lymphatic/Immunilogical: No cervical lymphadenopathy. Cardiovascular: Normal rate, regular rhythm.  Grossly normal heart sounds.  Good peripheral circulation. Respiratory: Normal respiratory effort.  No retractions. Lungs CTAB. Gastrointestinal: Soft and nontender. No distention. No abdominal bruits. No CVA tenderness. Musculoskeletal: No obvious deformity to left shoulder.  Patient motor guarding palpation GH joint.  Patient decreased range of motion and strength. Neurologic:  Normal speech and language. No gross focal neurologic deficits are appreciated. No gait instability. Skin:  Skin is warm, dry and intact. No rash noted. Psychiatric: Mood and affect are normal. Speech and behavior are normal.  ____________________________________________   LABS (all labs ordered are listed, but only abnormal results are displayed)  Labs Reviewed - No data to display ____________________________________________  EKG   ____________________________________________  RADIOLOGY I, 04/02/20, personally viewed and evaluated these images (plain radiographs) as part of my medical decision making, as well as reviewing the written report by the radiologist.  ED MD interpretation: No acute findings. Official radiology report(s): DG Shoulder Left  Result Date: 02/01/2020 CLINICAL DATA:  Left shoulder pain EXAM: LEFT SHOULDER - 2+ VIEW COMPARISON:  Left shoulder radiographs dated 04/04/2010 FINDINGS: There is no evidence of fracture or dislocation. There is no evidence of arthropathy or other focal bone abnormality. Soft tissues are unremarkable. IMPRESSION: Negative. Electronically Signed   By: 14/10/2009 M.D.   On: 02/01/2020 11:20    ____________________________________________   PROCEDURES  Procedure(s) performed (including Critical Care):  Procedures   ____________________________________________   INITIAL IMPRESSION / ASSESSMENT AND PLAN / ED COURSE  As part of my medical decision making, I reviewed the following data within the electronic MEDICAL RECORD NUMBER     Patient presents  with left shoulder pain secondary to lifting pulling incident yesterday.  Patient complaint physical exam is consistent with strain.  Discussed no acute findings x-ray.  Patient placed in arm sling and given discharge care instruction.  Take medication as directed.  Patient [facility].          ____________________________________________   FINAL CLINICAL IMPRESSION(S) / ED DIAGNOSES  Final diagnoses:  Strain of left shoulder, initial encounter     ED Discharge Orders         Ordered    oxyCODONE-acetaminophen (PERCOCET) 7.5-325 MG tablet  Every 6 hours PRN        02/01/20 1306    ibuprofen (ADVIL) 600 MG tablet  Every 8 hours PRN        02/01/20 1306          *Please note:  AADI BORDNER [name] was evaluated in Emergency Department on 02/01/2020 for the symptoms described in the history of present illness. He was evaluated in the context of the global COVID-19 pandemic, which necessitated consideration that the patient might be at risk for infection with the SARS-CoV-2 virus that causes COVID-19. Institutional protocols  and algorithms that pertain to the evaluation of patients at risk for COVID-19 are in a state of rapid change based on information released by regulatory bodies including the CDC and federal and state organizations. These policies and algorithms were followed during the patient's care in the ED.  Some ED evaluations and interventions may be delayed as a result of limited staffing during and the pandemic.*   Note:  This document was prepared using Dragon voice recognition software and may include unintentional dictation errors.    Joni Reining, PA-C 02/01/20 1311    Merwyn Katos, MD 02/01/20 [phone]

## 2020-04-25 ENCOUNTER — Emergency Department
Admission: EM | Admit: 2020-04-25 | Discharge: 2020-04-25 | Disposition: A | Discharge status: Home / Self Care | Payer: Medicaid Other | Source: Home / Self Care | Attending: Emergency Medicine | Admitting: Emergency Medicine

## 2020-04-25 ENCOUNTER — Encounter: Payer: Self-pay | Admitting: Emergency Medicine

## 2020-04-25 ENCOUNTER — Ambulatory Visit: Payer: Self-pay | Admitting: *Deleted

## 2020-04-25 ENCOUNTER — Emergency Department: Payer: Medicaid Other

## 2020-04-25 ENCOUNTER — Other Ambulatory Visit: Payer: Self-pay

## 2020-04-25 ENCOUNTER — Observation Stay
Admission: EM | Admit: 2020-04-25 | Discharge: 2020-04-26 | Disposition: A | Discharge status: Home / Self Care | Payer: Medicaid Other | Attending: Emergency Medicine | Admitting: Emergency Medicine

## 2020-04-25 DIAGNOSIS — F17210 Nicotine dependence, cigarettes, uncomplicated: Secondary | ICD-10-CM | POA: Diagnosis not present

## 2020-04-25 DIAGNOSIS — R1030 Lower abdominal pain, unspecified: Secondary | ICD-10-CM | POA: Diagnosis present

## 2020-04-25 DIAGNOSIS — K529 Noninfective gastroenteritis and colitis, unspecified: Secondary | ICD-10-CM

## 2020-04-25 DIAGNOSIS — K5792 Diverticulitis of intestine, part unspecified, without perforation or abscess without bleeding: Secondary | ICD-10-CM | POA: Insufficient documentation

## 2020-04-25 DIAGNOSIS — Z20822 Contact with and (suspected) exposure to covid-19: Secondary | ICD-10-CM | POA: Diagnosis not present

## 2020-04-25 DIAGNOSIS — D72829 Elevated white blood cell count, unspecified: Secondary | ICD-10-CM | POA: Diagnosis not present

## 2020-04-25 DIAGNOSIS — R000 Tachycardia, unspecified: Secondary | ICD-10-CM | POA: Diagnosis not present

## 2020-04-25 DIAGNOSIS — R1032 Left lower quadrant pain: Secondary | ICD-10-CM

## 2020-04-25 DIAGNOSIS — Z79899 Other long term (current) drug therapy: Secondary | ICD-10-CM | POA: Insufficient documentation

## 2020-04-25 DIAGNOSIS — I10 Essential (primary) hypertension: Secondary | ICD-10-CM | POA: Diagnosis not present

## 2020-04-25 DIAGNOSIS — J45909 Unspecified asthma, uncomplicated: Secondary | ICD-10-CM | POA: Diagnosis not present

## 2020-04-25 DIAGNOSIS — A419 Sepsis, unspecified organism: Secondary | ICD-10-CM

## 2020-04-25 LAB — COMPREHENSIVE METABOLIC PANEL
ALT: 41 U/L (ref 0–44)
ALT: 44 U/L (ref 0–44)
AST: 25 U/L (ref 15–41)
AST: 29 U/L (ref 15–41)
Albumin: 3.9 g/dL (ref 3.5–5.0)
Albumin: 3.9 g/dL (ref 3.5–5.0)
Alkaline Phosphatase: 104 U/L (ref 38–126)
Alkaline Phosphatase: 96 U/L (ref 38–126)
Anion gap: 7 (ref 5–15)
Anion gap: 9 (ref 5–15)
BUN: 16 mg/dL (ref 6–20)
BUN: 15 mg/dL (ref 6–20)
CO2: 25 mmol/L (ref 22–32)
CO2: 23 mmol/L (ref 22–32)
Calcium: 8.8 mg/dL — ABNORMAL LOW (ref 8.9–10.3)
Calcium: 8.6 mg/dL — ABNORMAL LOW (ref 8.9–10.3)
Chloride: 106 mmol/L (ref 98–111)
Chloride: 105 mmol/L (ref 98–111)
Creatinine, Ser: 0.73 mg/dL (ref 0.61–1.24)
Creatinine, Ser: 0.65 mg/dL (ref 0.61–1.24)
GFR, Estimated: >60 mL/min (ref >60)
GFR, Estimated: >60 mL/min (ref >60)
Glucose, Bld: 157 mg/dL — ABNORMAL HIGH (ref 70–99)
Glucose, Bld: 130 mg/dL — ABNORMAL HIGH (ref 70–99)
Potassium: 3.6 mmol/L (ref 3.5–5.1)
Potassium: 3.6 mmol/L (ref 3.5–5.1)
Sodium: 138 mmol/L (ref 135–145)
Sodium: 137 mmol/L (ref 135–145)
Total Bilirubin: 0.9 mg/dL (ref 0.3–1.2)
Total Bilirubin: 1 mg/dL (ref 0.3–1.2)
Total Protein: 6.9 g/dL (ref 6.5–8.1)
Total Protein: 6.8 g/dL (ref 6.5–8.1)

## 2020-04-25 LAB — GASTROINTESTINAL PANEL BY PCR, STOOL (REPLACES STOOL CULTURE)

## 2020-04-25 LAB — TSH: TSH: 1.38 u[IU]/mL (ref 0.350–4.500)

## 2020-04-25 LAB — CBC WITH DIFFERENTIAL/PLATELET
Abs Immature Granulocytes: 0.04 10*3/uL (ref 0.00–0.07)
Basophils Absolute: 0 10*3/uL (ref 0.0–0.1)
Basophils Relative: 0 %
Eosinophils Absolute: 0.7 10*3/uL — ABNORMAL HIGH (ref 0.0–0.5)
Eosinophils Relative: 6 %
HCT: 43.4 % (ref 39.0–52.0)
Hemoglobin: 14.8 g/dL (ref 13.0–17.0)
Immature Granulocytes: 0 %
Lymphocytes Relative: 18 %
Lymphs Abs: 2 10*3/uL (ref 0.7–4.0)
MCH: 31.4 pg (ref 26.0–34.0)
MCHC: 34.1 g/dL (ref 30.0–36.0)
MCV: 92.1 fL (ref 80.0–100.0)
Monocytes Absolute: 0.7 10*3/uL (ref 0.1–1.0)
Monocytes Relative: 6 %
Neutro Abs: 7.6 10*3/uL (ref 1.7–7.7)
Neutrophils Relative %: 70 %
Platelets: 219 10*3/uL (ref 150–400)
RBC: 4.71 MIL/uL (ref 4.22–5.81)
RDW: 12.9 % (ref 11.5–15.5)
WBC: 11.1 10*3/uL — ABNORMAL HIGH (ref 4.0–10.5)
nRBC: 0 % (ref 0.0–0.2)

## 2020-04-25 LAB — RESP PANEL BY RT-PCR (FLU A&B, COVID) ARPGX2
Influenza A by PCR: NEGATIVE
Influenza B by PCR: NEGATIVE
SARS Coronavirus 2 by RT PCR: NEGATIVE

## 2020-04-25 LAB — TYPE AND SCREEN
ABO/RH(D): A POS
Antibody Screen: NEGATIVE

## 2020-04-25 LAB — URINALYSIS, COMPLETE (UACMP) WITH MICROSCOPIC
Bacteria, UA: NONE SEEN
Bilirubin Urine: NEGATIVE
Glucose, UA: NEGATIVE mg/dL
Hgb urine dipstick: NEGATIVE
Ketones, ur: NEGATIVE mg/dL
Nitrite: NEGATIVE
Protein, ur: NEGATIVE mg/dL
Specific Gravity, Urine: 1.025 (ref 1.005–1.030)
Squamous Epithelial / HPF: NONE SEEN (ref 0–5)
pH: 5 (ref 5.0–8.0)

## 2020-04-25 LAB — APTT: aPTT: 31 seconds (ref 24–36)

## 2020-04-25 LAB — LIPASE, BLOOD
Lipase: 33 U/L (ref 11–51)
Lipase: 26 U/L (ref 11–51)

## 2020-04-25 LAB — CBC
HCT: 44 % (ref 39.0–52.0)
Hemoglobin: 15 g/dL (ref 13.0–17.0)
MCH: 31.2 pg (ref 26.0–34.0)
MCHC: 34.1 g/dL (ref 30.0–36.0)
MCV: 91.5 fL (ref 80.0–100.0)
Platelets: 226 10*3/uL (ref 150–400)
RBC: 4.81 MIL/uL (ref 4.22–5.81)
RDW: 13 % (ref 11.5–15.5)
WBC: 15.8 10*3/uL — ABNORMAL HIGH (ref 4.0–10.5)
nRBC: 0 % (ref 0.0–0.2)

## 2020-04-25 LAB — C DIFFICILE QUICK SCREEN W PCR REFLEX
C Diff antigen: NEGATIVE
C Diff interpretation: NOT DETECTED
C Diff toxin: NEGATIVE

## 2020-04-25 LAB — LACTIC ACID, PLASMA
Lactic Acid, Venous: 1.4 mmol/L (ref 0.5–1.9)
Lactic Acid, Venous: 1.1 mmol/L (ref 0.5–1.9)

## 2020-04-25 LAB — PROTIME-INR
INR: 1 (ref 0.8–1.2)
Prothrombin Time: 12.9 seconds (ref 11.4–15.2)

## 2020-04-25 MED ORDER — SODIUM CHLORIDE 0.9 % IV SOLN: INTRAVENOUS | Status: DC

## 2020-04-25 MED ORDER — PIPERACILLIN-TAZOBACTAM 3.375 G IVPB
3.3750 g | Freq: Three times a day (TID) | INTRAVENOUS | Status: DC
  Administered 2020-04-26 (×2): 3.375 g via INTRAVENOUS
  Filled 2020-04-25 (×5): qty 50

## 2020-04-25 MED ORDER — DOCUSATE SODIUM 100 MG PO CAPS: 100.0000 mg | ORAL_CAPSULE | Freq: Two times a day (BID) | ORAL | 0 refills | Status: AC

## 2020-04-25 MED ORDER — ONDANSETRON HCL 4 MG/2ML IJ SOLN
4.0000 mg | Freq: Once | INTRAMUSCULAR | Status: AC
  Administered 2020-04-25: 4 mg via INTRAVENOUS
  Filled 2020-04-25: qty 2

## 2020-04-25 MED ORDER — IOHEXOL 300 MG/ML  SOLN
125.0000 mL | Freq: Once | INTRAMUSCULAR | Status: AC | PRN
  Administered 2020-04-25: 125 mL via INTRAVENOUS

## 2020-04-25 MED ORDER — ALBUTEROL SULFATE (2.5 MG/3ML) 0.083% IN NEBU: 2.5000 mg | INHALATION_SOLUTION | RESPIRATORY_TRACT | Status: DC | PRN

## 2020-04-25 MED ORDER — ACETAMINOPHEN 650 MG RE SUPP: 650.0000 mg | Freq: Four times a day (QID) | RECTAL | Status: DC | PRN

## 2020-04-25 MED ORDER — MORPHINE SULFATE (PF) 4 MG/ML IV SOLN
4.0000 mg | Freq: Once | INTRAVENOUS | Status: AC
  Administered 2020-04-25: 4 mg via INTRAVENOUS
  Filled 2020-04-25: qty 1

## 2020-04-25 MED ORDER — ONDANSETRON HCL 4 MG/2ML IJ SOLN: 4.0000 mg | Freq: Four times a day (QID) | INTRAMUSCULAR | Status: DC | PRN

## 2020-04-25 MED ORDER — HYDROMORPHONE HCL 1 MG/ML IJ SOLN
0.5000 mg | INTRAMUSCULAR | Status: DC | PRN
  Administered 2020-04-25 – 2020-04-26 (×2): 1 mg via INTRAVENOUS
  Filled 2020-04-25 (×2): qty 1

## 2020-04-25 MED ORDER — IOHEXOL 9 MG/ML PO SOLN
500.0000 mL | Freq: Once | ORAL | Status: AC
  Administered 2020-04-25: 1000 mL via ORAL

## 2020-04-25 MED ORDER — OXYCODONE-ACETAMINOPHEN 5-325 MG PO TABS: 1.0000 | ORAL_TABLET | ORAL | 0 refills | Status: DC | PRN

## 2020-04-25 MED ORDER — HYDROMORPHONE HCL 1 MG/ML IJ SOLN
1.0000 mg | Freq: Once | INTRAMUSCULAR | Status: AC
Start: 2020-04-25 — End: 2020-04-25
  Administered 2020-04-25: 18:00:00 1 mg via INTRAVENOUS
  Filled 2020-04-25: qty 1

## 2020-04-25 MED ORDER — HYDROCODONE-ACETAMINOPHEN 5-325 MG PO TABS
1.0000 | ORAL_TABLET | ORAL | Status: DC | PRN
  Administered 2020-04-26: 09:00:00 1 via ORAL
  Filled 2020-04-25: qty 1

## 2020-04-25 MED ORDER — PIPERACILLIN-TAZOBACTAM 3.375 G IVPB 30 MIN
3.3750 g | Freq: Once | INTRAVENOUS | Status: AC
  Administered 2020-04-25: 18:00:00 3.375 g via INTRAVENOUS
  Filled 2020-04-25: qty 50

## 2020-04-25 MED ORDER — LACTATED RINGERS IV BOLUS
1000.0000 mL | Freq: Once | INTRAVENOUS | Status: AC
  Administered 2020-04-25: 20:00:00 1000 mL via INTRAVENOUS

## 2020-04-25 MED ORDER — SODIUM CHLORIDE 0.9 % IV BOLUS
1000.0000 mL | Freq: Once | INTRAVENOUS | Status: AC
  Administered 2020-04-25: 1000 mL via INTRAVENOUS

## 2020-04-25 MED ORDER — ONDANSETRON HCL 4 MG PO TABS: 4.0000 mg | ORAL_TABLET | Freq: Four times a day (QID) | ORAL | Status: DC | PRN

## 2020-04-25 MED ORDER — ACETAMINOPHEN 325 MG PO TABS: 650.0000 mg | ORAL_TABLET | Freq: Four times a day (QID) | ORAL | Status: DC | PRN

## 2020-04-25 MED ORDER — PANTOPRAZOLE SODIUM 40 MG IV SOLR
40.0000 mg | INTRAVENOUS | Status: DC
Start: 2020-04-25 — End: 2020-04-26
  Administered 2020-04-25: 22:00:00 40 mg via INTRAVENOUS
  Filled 2020-04-25: qty 40

## 2020-04-25 MED ORDER — AMOXICILLIN-POT CLAVULANATE 875-125 MG PO TABS: 1.0000 | ORAL_TABLET | Freq: Two times a day (BID) | ORAL | 0 refills | Status: AC

## 2020-04-25 MED ORDER — LACTATED RINGERS IV BOLUS
1000.0000 mL | Freq: Once | INTRAVENOUS | Status: AC
  Administered 2020-04-25: 1000 mL via INTRAVENOUS

## 2020-04-25 NOTE — Consult Note (Signed)
Pharmacy Antibiotic Note  Anthony [name] is a 38 y.o. male admitted on 04/25/2020 with intra-abdominal infection.  Pharmacy has been consulted for Zosyn dosing.  Plan: Zosyn 3.375g IV q8h (4 hour infusion).  Height: 6' (182.9 cm) Weight: (!) 140.6 kg (310 lb) IBW/kg (Calculated) : 77.6  Temp (24hrs), Avg:98.6 F (37 C), Min:98.5 F (36.9 C), Max:98.6 F (37 C)  Recent Labs  Lab 04/25/20 0658 04/25/20 1402 04/25/20 1700  WBC 11.1* 15.8*  --   CREATININE 0.73 0.65  --   LATICACIDVEN  --   --  1.4    Estimated Creatinine Clearance: 178.5 mL/min (by C-G formula based on SCr of 0.65 mg/dL).    No Known Allergies  Antimicrobials this admission: Zosyn 3.375g q8h (12/28 >>  Dose adjustments this admission: N/a  Microbiology results: 12/28 BCx: sent/pending 12/28 UCx: sent/pending  12/28 GI Panel: sent/pending  12/28 CDiff:  Ag-neg/Tox-neg 12/28 Sars-Cov 2 - neg  Thank you for allowing pharmacy to be a part of this patient's care.  Martyn Malay 04/25/2020 7:18 PM

## 2020-04-25 NOTE — ED Triage Notes (Signed)
Seen earlier through ED for same.  Continues to c/o worsening abdominal pain and diarrhea.

## 2020-04-25 NOTE — ED Triage Notes (Signed)
Patient ambulatory to triage with steady gait, without difficulty or distress noted; since Sunday having diarrhea with rectal bleeding noted and lower abd pain; st hx diverticultis

## 2020-04-25 NOTE — Telephone Encounter (Signed)
C/o continuing diarrhea 6-8 times liquid watery stool since discharge from ED approx. 10 am. Patient reports abdominal pain, upper and lower abdomen. Bright red blood noted in stool. Dizziness reported, denies unable to walk at this time. Instructed patient to go to ED. Care advise given to patient and his significant other. Significant other verbalized understanding for patent to return to ED due to bright red blood noted in every stool.   Reason for Disposition  [1] Blood in the stool AND [2] moderate or large amount of blood  Answer Assessment - Initial Assessment Questions 1. DIARRHEA SEVERITY: "How bad is the diarrhea?" "How many extra stools have you had in the past 24 hours than normal?"    - NO DIARRHEA (SCALE 0)   - MILD (SCALE 1-3): Few loose or mushy BMs; increase of 1-3 stools over normal daily number of stools; mild increase in ostomy output.   -  MODERATE (SCALE 4-7): Increase of 4-6 stools daily over normal; moderate increase in ostomy output. * SEVERE (SCALE 8-10; OR 'WORST POSSIBLE'): Increase of 7 or more stools daily over normal; moderate increase in ostomy output; incontinence.     Moderate 6-8 watery bright red diarrheal stools since discharge from ED at 10 am .. 2. ONSET: "When did the diarrhea begin?"      Since discharge at 10 am this morning  3. BM CONSISTENCY: "How loose or watery is the diarrhea?"      *l** 4. VOMITING: "Are you also vomiting?" If Yes, ask: "How many times in the past 24 hours?"       Liquid bright red  5. ABDOMINAL PAIN: "Are you having any abdominal pain?" If Yes, ask: "What does it feel like?" (e.g., crampy, dull, intermittent, constant)      abdominal upper and lower cramping  6. ABDOMINAL PAIN SEVERITY: If present, ask: "How bad is the pain?"  (e.g., Scale 1-10; mild, moderate, or severe)   - MILD (1-3): doesn't interfere with normal activities, abdomen soft and not tender to touch    - MODERATE (4-7): interferes with normal activities or  awakens from sleep, tender to touch    - SEVERE (8-10): excruciating pain, doubled over, unable to do any normal activities       Na  7. ORAL INTAKE: If vomiting, "Have you been able to drink liquids?" "How much fluids have you had in the past 24 hours?"     Na  8. HYDRATION: "Any signs of dehydration?" (e.g., dry mouth [not just dry lips], too weak to stand, dizziness, new weight loss) "When did you last urinate?"     dizziness 9. EXPOSURE: "Have you traveled to a foreign country recently?" "Have you been exposed to anyone with diarrhea?" "Could you have eaten any food that was spoiled?"     Na  10. ANTIBIOTIC USE: "Are you taking antibiotics now or have you taken antibiotics in the past 2 months?"       na 11. OTHER SYMPTOMS: "Do you have any other symptoms?" (e.g., fever, blood in stool)       Bright red blood in stool  12. PREGNANCY: "Is there any chance you are pregnant?" "When was your last menstrual period?"       na  Protocols used: [facility]

## 2020-04-25 NOTE — ED Notes (Signed)
Message sent to admitting RN. Awaiting reply. Pt in NAD at this time. VSS 

## 2020-04-25 NOTE — Discharge Instructions (Signed)
As we discussed please take your antibiotics for their entire course.  Please take your pain medication as written, as needed.  Do not drink alcohol or drive while taking your pain medication.  Please follow-up with GI medicine by calling the number provided to arrange a follow-up appointment as soon as possible as he will likely require colonoscopy in the near future.  Return to the emergency department for any increase in abdominal pain, development of fever, or nausea/vomiting unable to take your antibiotics.

## 2020-04-25 NOTE — ED Provider Notes (Signed)
[facility] Emergency Department Provider Note  ____________________________________________   Event Date/Time   First MD Initiated Contact with Patient 04/25/20 1657     (approximate)  I have reviewed the triage vital signs and the nursing notes.   HISTORY  Chief Complaint Abdominal Pain   HPI Anthony [name] is a 39 y.o. male with past medical history of asthma, HTN, arthritis and previous diverticulitis who returns to emergency room after initially being evaluated this morning and diagnosed with colitis and diverticulitis after he states he has had almost 10 episodes of bloody diarrhea and feels too weak to get around his home. Also states he has not been able to pick up his Augmentin prescription. States he has been feeling poorly for 2 days with abdominal pain diarrhea and nausea. Has not had any fevers, cough, chest pain, shortness of breath, earache, sore throat, rash, extremity pain or urinary symptoms. This feels similar but worse than prior diverticulitis episodes etc. past. No clear alleviating or aggravating factors. No recent traumatic injuries. Patient denies tobacco abuse EtOH use or illicit drug use.         Past Medical History:  Diagnosis Date  . Arthritis   . Asthma   . Diverticulitis   . Hypertension     There are no problems to display for this patient.   Past Surgical History:  Procedure Laterality Date  . CHOLECYSTECTOMY    . SHOULDER ARTHROSCOPY      Prior to Admission medications   Medication Sig Start Date End Date Taking? Authorizing Provider  amoxicillin-clavulanate (AUGMENTIN) 875-125 MG tablet Take 1 tablet by mouth 2 (two) times daily for 14 days. 04/25/20 05/09/20  Minna Antis, MD  diclofenac (VOLTAREN) 75 MG EC tablet Take 1 tablet by mouth 2 (two) times daily. 01/02/17   [provider]  docusate sodium (COLACE) 100 MG capsule Take 1 capsule (100 mg total) by mouth 2 (two) times daily. 04/25/20  05/25/20  Minna Antis, MD  ibuprofen (ADVIL) 600 MG tablet Take 1 tablet (600 mg total) by mouth every 8 (eight) hours as needed. 02/01/20   Joni Reining, PA-C  lisinopril (PRINIVIL,ZESTRIL) 20 MG tablet Take 40 mg by mouth daily.     [provider]  oxyCODONE-acetaminophen (PERCOCET) 5-325 MG tablet Take 1 tablet by mouth every 4 (four) hours as needed for severe pain. 04/25/20   Minna Antis, MD  pantoprazole (PROTONIX) 40 MG tablet Take 40 mg by mouth daily.    [provider]  phentermine 37.5 MG capsule Take 37.5 mg by mouth every morning.    [provider]  promethazine (PHENERGAN) 12.5 MG tablet Take 1 tablet (12.5 mg total) by mouth every 6 (six) hours as needed for nausea or vomiting. Patient not taking: Reported on 03/27/2017 01/01/17   Willy Eddy, MD    Allergies Patient has no known allergies.  No family history on file.  Social History Social History   Tobacco Use  . Smoking status: Current Every Day Smoker    Packs/day: 1.00    Types: Cigarettes  . Smokeless tobacco: Never Used  Substance Use Topics  . Alcohol use: No  . Drug use: No    Review of Systems  Review of Systems  Constitutional: Positive for malaise/fatigue. Negative for chills and fever.  HENT: Negative for sore throat.   Eyes: Negative for pain.  Respiratory: Negative for cough and stridor.   Cardiovascular: Negative for chest pain.  Gastrointestinal: Positive for abdominal pain,  blood in stool, diarrhea and nausea. Negative for vomiting.  Genitourinary: Negative for dysuria.  Musculoskeletal: Negative for myalgias.  Skin: Negative for rash.  Neurological: Negative for seizures, loss of consciousness and headaches.  Psychiatric/Behavioral: Negative for suicidal ideas.  All other systems reviewed and are negative.     ____________________________________________   PHYSICAL EXAM:  VITAL SIGNS: ED Triage Vitals  Enc Vitals Group     BP  04/25/20 1404 (!) 164/108     Pulse Rate 04/25/20 1402 (!) 113     Resp 04/25/20 1402 16     Temp 04/25/20 1402 98.5 F (36.9 C)     Temp src --      SpO2 04/25/20 1402 95 %     Weight 04/25/20 1400 (!) 310 lb (140.6 kg)     Height 04/25/20 1400 6' (1.829 m)     Head Circumference --      Peak Flow --      Pain Score 04/25/20 1400 8     Pain Loc --      Pain Edu? --      Excl. in GC? --    Vitals:   04/25/20 1404 04/25/20 1808  BP: (!) 164/108 131/80  Pulse: (!) 113 96  Resp:  13  Temp:    SpO2:  99%   Physical Exam Vitals and nursing note reviewed.  Constitutional:      Appearance: He is well-developed and well-nourished. He is ill-appearing.  HENT:     Head: Normocephalic and atraumatic.     Right Ear: External ear normal.     Left Ear: External ear normal.     Nose: Nose normal.  Eyes:     Conjunctiva/sclera: Conjunctivae normal.  Cardiovascular:     Rate and Rhythm: Regular rhythm. Tachycardia present.     Heart sounds: No murmur heard.   Pulmonary:     Effort: Pulmonary effort is normal. No respiratory distress.     Breath sounds: Normal breath sounds.  Abdominal:     Palpations: Abdomen is soft.     Tenderness: There is generalized abdominal tenderness.  Genitourinary:    Rectum: External hemorrhoid present. No anal fissure.     Comments: No active bleeding visualized. Musculoskeletal:        General: No edema.     Cervical back: Neck supple.  Skin:    General: Skin is warm and dry.     Capillary Refill: Capillary refill takes less than 2 seconds.  Neurological:     Mental Status: He is alert and oriented to person, place, and time.  Psychiatric:        Mood and Affect: Mood and affect and mood normal.      ____________________________________________   LABS (all labs ordered are listed, but only abnormal results are displayed)  Labs Reviewed  COMPREHENSIVE METABOLIC PANEL - Abnormal; Notable for the following components:      Result Value    Glucose, Bld 130 (*)    Calcium 8.6 (*)    All other components within normal limits  CBC - Abnormal; Notable for the following components:   WBC 15.8 (*)    All other components within normal limits  RESP PANEL BY RT-PCR (FLU A&B, COVID) ARPGX2  C DIFFICILE QUICK SCREEN W PCR REFLEX  GASTROINTESTINAL PANEL BY PCR, STOOL (REPLACES STOOL CULTURE)  CULTURE, BLOOD (ROUTINE X 2)  CULTURE, BLOOD (ROUTINE X 2)  URINE CULTURE  LIPASE, BLOOD  LACTIC ACID, PLASMA  PROTIME-INR  APTT  URINALYSIS, COMPLETE (UACMP) WITH MICROSCOPIC  LACTIC ACID, PLASMA   ____________________________________________  EKG  Sinus tachycardia with ventricular rate of 108, right axis deviation, unremarkable intervals, no clear evidence of acute ischemia. ____________________________________________  RADIOLOGY  ED MD interpretation: Colitis and diverticulitis without evidence of abscess or perforation.  No SBO.  No other acute intra-abdominal or pelvic process.  Official radiology report(s): CT ABDOMEN PELVIS W CONTRAST  Result Date: 04/25/2020 CLINICAL DATA:  39 year old male with abdominal pain, diarrhea and rectal bleeding times 2 days. Prior sigmoid diverticulitis. EXAM: CT ABDOMEN AND PELVIS WITH CONTRAST TECHNIQUE: Multidetector CT imaging of the abdomen and pelvis was performed using the standard protocol following bolus administration of intravenous contrast. CONTRAST:  125mL OMNIPAQUE IOHEXOL 300 MG/ML  SOLN COMPARISON:  CT Abdomen and Pelvis 03/27/2017 and earlier. FINDINGS: Lower chest: Negative. Hepatobiliary: Chronically absent gallbladder. Liver is stable since 2014 and negative. Pancreas: Negative. Spleen: Negative. Adrenals/Urinary Tract: Normal adrenal glands. Symmetric bilateral renal enhancement. Delayed excretory images not provided. No nephrolithiasis or hydronephrosis. Ureters remain within normal limits. Urinary bladder abuts the abnormal sigmoid colon but remains within normal limits.  Incidental pelvic phleboliths. Stomach/Bowel: Extensive diverticulosis throughout the large bowel, especially from the mid transverse colon distally. Abnormal large bowel wall thickening beginning at the distal transverse colon and continuing throughout the descending and sigmoid colon. Inflammation is most pronounced in the distal sigmoid where thickened diverticula are noted in addition to the generalized wall thickening and confluent mesenteric stranding occurs (coronal image 59). No extraluminal gas. No drainable fluid collection. Left pelvic side wall inflammatory stranding. Normal appendix. Negative terminal ileum. Oral contrast has not yet reached the small bowel. No dilated or inflamed small bowel. Decompressed and negative stomach. Small duodenal diverticulum in the midline (series 2, image 43) with no adverse features. Vascular/Lymphatic: Suboptimal intravascular contrast bolus but the major vascular structures in the abdomen and pelvis appear to be patent. No lymphadenopathy. Reproductive: Negative. Other: Trace free fluid in the pelvis. Musculoskeletal: Negative for age. No acute osseous abnormality identified. IMPRESSION: 1. Acute Colitis and/or Diverticulitis from the distal transverse colon through the sigmoid. Inflammation is maximal in the sigmoid colon with inflamed diverticula there. Trace free fluid but no perforation, abscess, or bowel obstruction. 2. No other acute or inflammatory process in the abdomen or pelvis. Electronically Signed   By: Odessa FlemingH  Hall M.D.   On: 04/25/2020 09:44    ____________________________________________   PROCEDURES  Procedure(s) performed (including Critical Care):  .Critical Care Performed by: Gilles ChiquitoSmith, Letia Guidry P, MD Authorized by: Gilles ChiquitoSmith, Leonette Tischer P, MD   Critical care provider statement:    Critical care time (minutes):  45   Critical care time was exclusive of:  Separately billable procedures and treating other patients   Critical care was necessary to  treat or prevent imminent or life-threatening deterioration of the following conditions:  Sepsis   Critical care was time spent personally by me on the following activities:  Discussions with consultants, evaluation of patient's response to treatment, examination of patient, ordering and performing treatments and interventions, ordering and review of laboratory studies, ordering and review of radiographic studies, pulse oximetry, re-evaluation of patient's condition, obtaining history from patient or surrogate and review of old charts     ____________________________________________   INITIAL IMPRESSION / ASSESSMENT AND PLAN / ED COURSE      Patient presents for assessment of persistent pain as well as worsening and more frequent bloody diarrhea and generalized weakness after recent diagnosis of diverticulitis and colitis.  On arrival patient  is tachycardic with a heart rate of 113 otherwise stable vital signs on room air.  Exam as above remarkable for abdominal tenderness.  Patient has external hemorrhoids but no active bleeding hemorrhoids or fissures.  I did review patient's CT obtained earlier today which did show evidence of colitis and diverticulitis without evidence of abscess or perforation.  Patient has no other acute intra-abdominal findings including evidence of SBO, pancreatitis, cholecystitis, pyelonephritis, or pancreatitis.    CBC remarkable for WBC count of 15.8 elevated from 11.1 obtained earlier this morning.  Hemoglobin and platelets are unremarkable.  CBC without evidence of significant ocular metabolic derangements.  No evidence of cholestasis or hepatitis.  Lipase is not elevated 26 not consistent with pancreatitis.  Lactic is within normal limits.  Given tachycardia and elevated white blood cell count with concern for infectious etiology had sepsis initiated and blood cultures were obtained patient was given IV fluids and broad-spectrum antibiotics.  He was also given below  noted analgesia.  I will plan to admit to medicine service for further evaluation and management.  GI pathogen study sent.  ____________________________________________   FINAL CLINICAL IMPRESSION(S) / ED DIAGNOSES  Final diagnoses:  Colitis  Diverticulitis  Leukocytosis, unspecified type  Tachycardia  Sepsis, due to unspecified organism, unspecified whether acute organ dysfunction present (HCC)    Medications  lactated ringers bolus 1,000 mL (has no administration in time range)  lactated ringers bolus 1,000 mL (1,000 mLs Intravenous New Bag/Given 04/25/20 1800)  piperacillin-tazobactam (ZOSYN) IVPB 3.375 g (3.375 g Intravenous New Bag/Given 04/25/20 1800)  HYDROmorphone (DILAUDID) injection 1 mg (1 mg Intravenous Given 04/25/20 1801)     ED Discharge Orders    None       Note:  This document was prepared using Dragon voice recognition software and may include unintentional dictation errors.   Gilles Chiquito, MD 04/25/20 [phone]

## 2020-04-25 NOTE — H&P (Signed)
History and Physical    [name] STM:[id number] DOB: 1980-07-18 DOA: 04/25/2020  PCP: Leanna Sato, MD  Patient coming from:  home  I have personally briefly reviewed patient's old medical records in [facility]  Chief Complaint: abdominal pain diarrhea x 2 days   HPI: Anthony [name] is a 39 y.o. male with medical history significant of  of asthma, diverticulitis, hypertension presents to the ED for lower abdominal pain described as cramping and diarrhea that is intermittently bloody. He was seen in ed early on 12/28 and diagnosed with acute diverticulitis, he was noted to have mild elevation in wbc at 11 and stable h/h and noted to be tolerating po and was discharged for out patient treatment with oral antibiotics. Patient return hours later, noting he has had close to 10 episodes of bloody diarrhea and noted he is now too weak to complete his ADLS  So due to progressive symptoms returned to ed for further evaluation. Patient  Also notes associated chills but no fever. He denies any nausea or vomiting, or weight loss. Last meal was last pm he had pizza. Patient currently states he is able to drink liquids without difficulty.  ED Course:  IN ed vitals:afeb, hr 113, rr 16, sat 95% Labs wbc 15.8 (11.1) , hgb 15, plt 226, lipase 26,  Na:137, K 3.6, glu 130, cr 0.65 ,  EKG: sinus tachycardia   tx with zosyn , lLR, dialuidi 1mg  x 1 Review of Systems: As per HPI otherwise 10 point review of systems negative.  + HA, no sob , generalized weakness, no no presyncope, no chest pain.   Past Medical History:  Diagnosis Date  . Arthritis   . Asthma   . Diverticulitis   . Hypertension     Past Surgical History:  Procedure Laterality Date  . CHOLECYSTECTOMY    . SHOULDER ARTHROSCOPY       reports that he has been smoking cigarettes. He has been smoking about 1.00 pack per day. He has never used smokeless tobacco. He reports that he does not drink alcohol and does not use  drugs.  No Known Allergies  No family history on file.  Mother , DAD, Brother: HTN    Prior to Admission medications   Medication Sig Start Date End Date Taking? Authorizing Provider  amoxicillin-clavulanate (AUGMENTIN) 875-125 MG tablet Take 1 tablet by mouth 2 (two) times daily for 14 days. 04/25/20 05/09/20  07/07/20, MD  diclofenac (VOLTAREN) 75 MG EC tablet Take 1 tablet by mouth 2 (two) times daily. 01/02/17   [provider]  docusate sodium (COLACE) 100 MG capsule Take 1 capsule (100 mg total) by mouth 2 (two) times daily. 04/25/20 05/25/20  05/27/20, MD  ibuprofen (ADVIL) 600 MG tablet Take 1 tablet (600 mg total) by mouth every 8 (eight) hours as needed. 02/01/20   04/02/20, PA-C  lisinopril (PRINIVIL,ZESTRIL) 20 MG tablet Take 40 mg by mouth daily.     [provider]  oxyCODONE-acetaminophen (PERCOCET) 5-325 MG tablet Take 1 tablet by mouth every 4 (four) hours as needed for severe pain. 04/25/20   04/27/20, MD  pantoprazole (PROTONIX) 40 MG tablet Take 40 mg by mouth daily.    [provider]  phentermine 37.5 MG capsule Take 37.5 mg by mouth every morning.    [provider]  promethazine (PHENERGAN) 12.5 MG tablet Take 1 tablet (12.5 mg total) by mouth every 6 (six) hours as needed  for nausea or vomiting. Patient not taking: Reported on 03/27/2017 01/01/17   Willy Eddy, MD    Physical Exam: Vitals:   04/25/20 1400 04/25/20 1402 04/25/20 1404 04/25/20 1808  BP:   (!) 164/108 131/80  Pulse:  (!) 113 (!) 113 96  Resp:  16  13  Temp:  98.5 F (36.9 C)    SpO2:  95%  99%  Weight: (!) 140.6 kg     Height: 6' (1.829 m)       Constitutional: NAD, calm, comfortable Vitals:   04/25/20 1400 04/25/20 1402 04/25/20 1404 04/25/20 1808  BP:   (!) 164/108 131/80  Pulse:  (!) 113 (!) 113 96  Resp:  16  13  Temp:  98.5 F (36.9 C)    SpO2:  95%  99%  Weight: (!) 140.6 kg     Height: 6' (1.829 m)       Eyes: PERRL, lids and conjunctivae normal ENMT: Mucous membranes are moist. Posterior pharynx clear of any exudate or lesions.Normal dentition.  Neck: normal, supple, no masses, no thyromegaly Respiratory: clear to auscultation bilaterally, no wheezing, no crackles. Normal respiratory effort. No accessory muscle use.  Cardiovascular: Regular rate and rhythm, no murmurs / rubs / gallops. No extremity edema. 2+ pedal pulses. No carotid bruits.  Abdomen: + tenderness lower quadrant no rebound, no masses palpated. No hepatosplenomegaly. Bowel sounds positive.  Musculoskeletal: no clubbing / cyanosis. No joint deformity upper and lower extremities. Good ROM, no contractures. Normal muscle tone.  Skin: no rashes, lesions, ulcers. No induration Neurologic: CN 2-12 grossly intact. Sensation intact, DTR normal. Strength 5/5 in all 4.  Psychiatric: Normal judgment and insight. Alert and oriented x 3. Normal mood.    Labs on Admission: I have personally reviewed following labs and imaging studies  CBC: Recent Labs  Lab 04/25/20 0658 04/25/20 1402  WBC 11.1* 15.8*  NEUTROABS 7.6  --   HGB 14.8 15.0  HCT 43.4 44.0  MCV 92.1 91.5  PLT 219 226   Basic Metabolic Panel: Recent Labs  Lab 04/25/20 0658 04/25/20 1402  NA 138 137  K 3.6 3.6  CL 106 105  CO2 25 23  GLUCOSE 157* 130*  BUN 16 15  CREATININE 0.73 0.65  CALCIUM 8.8* 8.6*   GFR: Estimated Creatinine Clearance: 178.5 mL/min (by C-G formula based on SCr of 0.65 mg/dL). Liver Function Tests: Recent Labs  Lab 04/25/20 0658 04/25/20 1402  AST 25 29  ALT 41 44  ALKPHOS 104 96  BILITOT 0.9 1.0  PROT 6.9 6.8  ALBUMIN 3.9 3.9   Recent Labs  Lab 04/25/20 0658 04/25/20 1402  LIPASE 33 26   No results for input(s): AMMONIA in the last 168 hours. Coagulation Profile: Recent Labs  Lab 04/25/20 1700  INR 1.0   Cardiac Enzymes: No results for input(s): CKTOTAL, CKMB, CKMBINDEX, TROPONINI in the last 168 hours. BNP (last  3 results) No results for input(s): PROBNP in the last 8760 hours. HbA1C: No results for input(s): HGBA1C in the last 72 hours. CBG: No results for input(s): GLUCAP in the last 168 hours. Lipid Profile: No results for input(s): CHOL, HDL, LDLCALC, TRIG, CHOLHDL, LDLDIRECT in the last 72 hours. Thyroid Function Tests: No results for input(s): TSH, T4TOTAL, FREET4, T3FREE, THYROIDAB in the last 72 hours. Anemia Panel: No results for input(s): VITAMINB12, FOLATE, FERRITIN, TIBC, IRON, RETICCTPCT in the last 72 hours. Urine analysis:    Component Value Date/Time   COLORURINE YELLOW (A) 04/25/2020 7253  APPEARANCEUR CLEAR (A) 04/25/2020 0658   APPEARANCEUR Clear 09/08/2012 1620   LABSPEC 1.025 04/25/2020 0658   LABSPEC 1.020 09/08/2012 1620   PHURINE 5.0 04/25/2020 0658   GLUCOSEU NEGATIVE 04/25/2020 0658   GLUCOSEU Negative 09/08/2012 1620   HGBUR NEGATIVE 04/25/2020 0658   BILIRUBINUR NEGATIVE 04/25/2020 0658   BILIRUBINUR Negative 09/08/2012 1620   KETONESUR NEGATIVE 04/25/2020 0658   PROTEINUR NEGATIVE 04/25/2020 0658   NITRITE NEGATIVE 04/25/2020 0658   LEUKOCYTESUR TRACE (A) 04/25/2020 0658   LEUKOCYTESUR Negative 09/08/2012 1620    Radiological Exams on Admission: CT ABDOMEN PELVIS W CONTRAST  Result Date: 04/25/2020 CLINICAL DATA:  39 year old male with abdominal pain, diarrhea and rectal bleeding times 2 days. Prior sigmoid diverticulitis. EXAM: CT ABDOMEN AND PELVIS WITH CONTRAST TECHNIQUE: Multidetector CT imaging of the abdomen and pelvis was performed using the standard protocol following bolus administration of intravenous contrast. CONTRAST:  OMNIPAQUE IOHEXOL 300 MG/ML  SOLN COMPARISON:  CT Abdomen and Pelvis 03/27/2017 and earlier. FINDINGS: Lower chest: Negative. Hepatobiliary: Chronically absent gallbladder. Liver is stable since 2014 and negative. Pancreas: Negative. Spleen: Negative. Adrenals/Urinary Tract: Normal adrenal glands. Symmetric bilateral renal  enhancement. Delayed excretory images not provided. No nephrolithiasis or hydronephrosis. Ureters remain within normal limits. Urinary bladder abuts the abnormal sigmoid colon but remains within normal limits. Incidental pelvic phleboliths. Stomach/Bowel: Extensive diverticulosis throughout the large bowel, especially from the mid transverse colon distally. Abnormal large bowel wall thickening beginning at the distal transverse colon and continuing throughout the descending and sigmoid colon. Inflammation is most pronounced in the distal sigmoid where thickened diverticula are noted in addition to the generalized wall thickening and confluent mesenteric stranding occurs (coronal image 59). No extraluminal gas. No drainable fluid collection. Left pelvic side wall inflammatory stranding. Normal appendix. Negative terminal ileum. Oral contrast has not yet reached the small bowel. No dilated or inflamed small bowel. Decompressed and negative stomach. Small duodenal diverticulum in the midline (series 2, image 43) with no adverse features. Vascular/Lymphatic: Suboptimal intravascular contrast bolus but the major vascular structures in the abdomen and pelvis appear to be patent. No lymphadenopathy. Reproductive: Negative. Other: Trace free fluid in the pelvis. Musculoskeletal: Negative for age. No acute osseous abnormality identified. IMPRESSION: 1. Acute Colitis and/or Diverticulitis from the distal transverse colon through the sigmoid. Inflammation is maximal in the sigmoid colon with inflamed diverticula there. Trace free fluid but no perforation, abscess, or bowel obstruction. 2. No other acute or inflammatory process in the abdomen or pelvis. Electronically Signed   By: Odessa Fleming M.D.   On: 04/25/2020 09:44    EKG: Independently reviewed sinus tach  Assessment/Plan   Acute diverticulitis  -clear liquids as tolerated  - iv antibiotics  -agree with stool pcr  - monitor h/h ,with hx of blood stools of note h/h  has remained stable at 15  - lactic within normal limits   Asthma -no current exacerbation  - prn nebs  - not currently on controller medication   Hypertension  -stable  -resume home regimen once med rec completed   DVT prophylaxis:scd Code Status: FULL  Family Communication: n/a Disposition Plan:admit observation  Consults called:n/a Admission status: observation    Lurline Del MD [facility]  If 7PM-7AM, please contact night-coverage [email or web address] Password [facility]  04/25/2020, 7:02 PM

## 2020-04-25 NOTE — ED Provider Notes (Signed)
[facility] Emergency Department Provider Note  Time seen: 8:01 AM  I have reviewed the triage vital signs and the nursing notes.   HISTORY  Chief Complaint Abdominal Pain   HPI Anthony [name] is a 39 y.o. male with a past medical history of asthma, diverticulitis, hypertension presents to the emergency department for lower abdominal pain and diarrhea.  According to the patient for the past 2 days he has been experiencing lower abdominal pain which he describes as dull aching type pain along with diarrhea.  Patient states occasionally there appears to be some blood in the diarrhea.  Patient states a history of diverticulitis to which this feels somewhat similar but not identical.  Denies any dysuria.  No vomiting.  No fever.   Past Medical History:  Diagnosis Date   Arthritis    Asthma    Diverticulitis    Hypertension     There are no problems to display for this patient.   Past Surgical History:  Procedure Laterality Date   CHOLECYSTECTOMY     SHOULDER ARTHROSCOPY      Prior to Admission medications   Medication Sig Start Date End Date Taking? Authorizing Provider  diclofenac (VOLTAREN) 75 MG EC tablet Take 1 tablet by mouth 2 (two) times daily. 01/02/17   [provider]  docusate sodium (COLACE) 100 MG capsule Take 1 tablet once or twice daily as needed for constipation while taking narcotic pain medicine 03/27/17   Loleta Rose, MD  ibuprofen (ADVIL) 600 MG tablet Take 1 tablet (600 mg total) by mouth every 8 (eight) hours as needed. 02/01/20   Joni Reining, PA-C  lisinopril (PRINIVIL,ZESTRIL) 20 MG tablet Take 40 mg by mouth daily.     [provider]  oxyCODONE-acetaminophen (PERCOCET) 7.5-325 MG tablet Take 1 tablet by mouth every 6 (six) hours as needed. 11/18/18   Joni Reining, PA-C  oxyCODONE-acetaminophen (PERCOCET) 7.5-325 MG tablet Take 1 tablet by mouth every 6 (six) hours as needed. 02/01/20   Joni Reining, PA-C  pantoprazole (PROTONIX) 40 MG tablet Take 40 mg by mouth daily.    [provider]  phentermine 37.5 MG capsule Take 37.5 mg by mouth every morning.    [provider]  promethazine (PHENERGAN) 12.5 MG tablet Take 1 tablet (12.5 mg total) by mouth every 6 (six) hours as needed for nausea or vomiting. Patient not taking: Reported on 03/27/2017 01/01/17   Willy Eddy, MD    No Known Allergies  No family history on file.  Social History Social History   Tobacco Use   Smoking status: Current Every Day Smoker    Packs/day: 1.00    Types: Cigarettes   Smokeless tobacco: Never Used  Substance Use Topics   Alcohol use: No   Drug use: No    Review of Systems Constitutional: Negative for fever. Cardiovascular: Negative for chest pain. Respiratory: Negative for shortness of breath. Gastrointestinal: Positive for lower abdominal pain moderate dull pain.  Occasional diarrhea.  Negative for vomiting. Genitourinary: Negative for urinary compaints Musculoskeletal: Negative for musculoskeletal complaints Neurological: Negative for headache All other ROS negative  ____________________________________________   PHYSICAL EXAM:  VITAL SIGNS: ED Triage Vitals  Enc Vitals Group     BP 04/25/20 0654 (!) 183/114     Pulse Rate 04/25/20 0654 (!) 111     Resp 04/25/20 0654 20     Temp 04/25/20 0654 98.6 F (37 C)     Temp Source 04/25/20 0654  Oral     SpO2 04/25/20 0654 96 %     Weight 04/25/20 0650 (!) 310 lb (140.6 kg)     Height 04/25/20 0650 6' (1.829 m)     Head Circumference --      Peak Flow --      Pain Score 04/25/20 0650 9     Pain Loc --      Pain Edu? --      Excl. in GC? --    Constitutional: Alert and oriented. Well appearing and in no distress. Eyes: Normal exam ENT      Head: Normocephalic and atraumatic.      Mouth/Throat: Mucous membranes are moist. Cardiovascular: Normal rate, regular rhythm.  Respiratory: Normal respiratory  effort without tachypnea nor retractions. Breath sounds are clear Gastrointestinal: Soft, mild suprapubic tenderness to palpation.  No rebound guarding or distention. Musculoskeletal: Nontender with normal range of motion in all extremities.  Neurologic:  Normal speech and language. No gross focal neurologic deficits Skin:  Skin is warm, dry and intact.  Psychiatric: Mood and affect are normal.   ____________________________________________    RADIOLOGY  CT scan shows a fairly significant amount of colitis versus diverticulitis from the distal transverse colon through the sigmoid colon.  No perforation or abscess.   ____________________________________________   INITIAL IMPRESSION / ASSESSMENT AND PLAN / ED COURSE  Pertinent labs & imaging results that were available during my care of the patient were reviewed by me and considered in my medical decision making (see chart for details).   Patient presents emergency department for lower abdominal pain x2 days along with diarrhea.  Differential would include colitis, diverticulitis, oncologic process, gastroenteritis, enteritis.  Lab work is largely within normal limits with a very slight leukocytosis.  LFTs and lipase are normal.  Urinalysis is pending.  Given the patient's history of diverticulitis we will proceed with CT imaging.  Patient agreeable plan of care.  We will treat pain and hydrate while awaiting CT.  CT consistent with fairly significant colitis/diverticulitis.  I did discuss with the patient outpatient versus inpatient therapy.  Patient would prefer outpatient therapy which I believe is reasonable at this point given overall reassuring work-up, afebrile and overall well-appearing.  We will start the patient on Augmentin, and pain medication.  I discussed these findings with the patient as well as the need to follow-up for colonoscopy in 6 weeks with GI medicine.  We will refer to GI medicine.  Patient agreeable plan of care.   Discussed strict return precautions for any worsening abdominal pain or development of fever.  Anthony [name] was evaluated in Emergency Department on 04/25/2020 for the symptoms described in the history of present illness. He was evaluated in the context of the global COVID-19 pandemic, which necessitated consideration that the patient might be at risk for infection with the SARS-CoV-2 virus that causes COVID-19. Institutional protocols and algorithms that pertain to the evaluation of patients at risk for COVID-19 are in a state of rapid change based on information released by regulatory bodies including the CDC and federal and state organizations. These policies and algorithms were followed during the patient's care in the ED.  ____________________________________________   FINAL CLINICAL IMPRESSION(S) / ED DIAGNOSES  Abdominal pain Colitis/diverticulitis   Minna Antis, MD 04/25/20 1005

## 2020-04-26 DIAGNOSIS — A419 Sepsis, unspecified organism: Secondary | ICD-10-CM

## 2020-04-26 DIAGNOSIS — K5792 Diverticulitis of intestine, part unspecified, without perforation or abscess without bleeding: Secondary | ICD-10-CM | POA: Diagnosis not present

## 2020-04-26 DIAGNOSIS — D72829 Elevated white blood cell count, unspecified: Secondary | ICD-10-CM | POA: Diagnosis not present

## 2020-04-26 LAB — CBC
HCT: 37.7 % — ABNORMAL LOW (ref 39.0–52.0)
Hemoglobin: 12.6 g/dL — ABNORMAL LOW (ref 13.0–17.0)
MCH: 31.3 pg (ref 26.0–34.0)
MCHC: 33.4 g/dL (ref 30.0–36.0)
MCV: 93.8 fL (ref 80.0–100.0)
Platelets: 186 10*3/uL (ref 150–400)
RBC: 4.02 MIL/uL — ABNORMAL LOW (ref 4.22–5.81)
RDW: 13.2 % (ref 11.5–15.5)
WBC: 10.7 10*3/uL — ABNORMAL HIGH (ref 4.0–10.5)
nRBC: 0 % (ref 0.0–0.2)

## 2020-04-26 LAB — COMPREHENSIVE METABOLIC PANEL
ALT: 33 U/L (ref 0–44)
AST: 19 U/L (ref 15–41)
Albumin: 3.2 g/dL — ABNORMAL LOW (ref 3.5–5.0)
Alkaline Phosphatase: 81 U/L (ref 38–126)
Anion gap: 7 (ref 5–15)
BUN: 12 mg/dL (ref 6–20)
CO2: 25 mmol/L (ref 22–32)
Calcium: 8.2 mg/dL — ABNORMAL LOW (ref 8.9–10.3)
Chloride: 105 mmol/L (ref 98–111)
Creatinine, Ser: 0.72 mg/dL (ref 0.61–1.24)
GFR, Estimated: >60 mL/min (ref >60)
Glucose, Bld: 87 mg/dL (ref 70–99)
Potassium: 3.3 mmol/L — ABNORMAL LOW (ref 3.5–5.1)
Sodium: 137 mmol/L (ref 135–145)
Total Bilirubin: 1.3 mg/dL — ABNORMAL HIGH (ref 0.3–1.2)
Total Protein: 5.8 g/dL — ABNORMAL LOW (ref 6.5–8.1)

## 2020-04-26 LAB — HEMOGLOBIN A1C
Hgb A1c MFr Bld: 5.7 % — ABNORMAL HIGH (ref 4.8–5.6)
Mean Plasma Glucose: 116.89 mg/dL

## 2020-04-26 LAB — HIV ANTIBODY (ROUTINE TESTING W REFLEX): HIV Screen 4th Generation wRfx: NONREACTIVE

## 2020-04-26 MED ORDER — LISINOPRIL 20 MG PO TABS
20.0000 mg | ORAL_TABLET | Freq: Every day | ORAL | Status: DC
  Administered 2020-04-26: 09:00:00 20 mg via ORAL
  Filled 2020-04-26: qty 1

## 2020-04-26 MED ORDER — POTASSIUM CHLORIDE CRYS ER 20 MEQ PO TBCR
40.0000 meq | EXTENDED_RELEASE_TABLET | Freq: Once | ORAL | Status: AC
  Administered 2020-04-26: 13:00:00 40 meq via ORAL
  Filled 2020-04-26: qty 2

## 2020-04-26 MED ORDER — LISINOPRIL 40 MG PO TABS: 40.0000 mg | ORAL_TABLET | Freq: Every day | ORAL | Status: DC

## 2020-04-26 NOTE — Progress Notes (Signed)
This RN provided discharge instructions and teaching to the patient. The patient demonstrated and verbalized understanding of the provided instructions. All outstanding questions resolved. R arm PIV removed. Cannula intact. Pt tolerated well. All belongings packed and in tow. Pt declined transport. Pt able to ambulate to private vehicle for discharge home.

## 2020-04-26 NOTE — Discharge Summary (Signed)
Physician Discharge Summary  Anthony [name] WUJ:[id number] DOB: 06/11/1980 DOA: 04/25/2020  PCP: Leanna Sato, MD  Admit date: 04/25/2020 Discharge date: 04/26/2020  Admitted From: Home Disposition: Home  Recommendations for Outpatient Follow-up:  1. Follow up with PCP in 1-2 weeks 2. Please obtain BMP/CBC in one week  Discharge Condition: Stable CODE STATUS: Full code Diet recommendation: Heart healthy  Brief/Interim Summary: 39 year old male presents to the emergency room with complaints of lower abdominal pain.  He was seen in the emergency room early on 12/28 and was diagnosed with acute diverticulitis.  He was discharged home on oral antibiotics.  Unfortunately, he had to return within hours since he had close to 10 episodes of bloody diarrhea and he was feeling increasingly weak.  He was admitted to the hospital and started on intravenous antibiotics.  He was placed on bowel rest.  Overall abdominal pain has resolved.  He is no longer having frequent stools.  Diet was advanced and is tolerating solid diet.  He will be transitioned to oral Augmentin, which was prescribed to him on his initial ER visit.  Patient feels comfortable with this plan.  He has been advised to return to the hospital if he has any high fevers, worsening abdominal pain or persistent vomiting.  Discharge Diagnoses:  Active Problems:   Acute diverticulitis    Discharge Instructions  Discharge Instructions    Diet - low sodium heart healthy   Complete by: As directed    Increase activity slowly   Complete by: As directed      Allergies as of 04/26/2020   No Known Allergies     Medication List    STOP taking these medications   diclofenac 75 MG EC tablet Commonly known as: VOLTAREN   ibuprofen 600 MG tablet Commonly known as: ADVIL   phentermine 37.5 MG capsule   promethazine 12.5 MG tablet Commonly known as: PHENERGAN     TAKE these medications   amoxicillin-clavulanate 875-125 MG  tablet Commonly known as: Augmentin Take 1 tablet by mouth 2 (two) times daily for 14 days.   docusate sodium 100 MG capsule Commonly known as: Colace Take 1 capsule (100 mg total) by mouth 2 (two) times daily.   lisinopril 40 MG tablet Commonly known as: ZESTRIL Take 1 tablet (40 mg total) by mouth daily. What changed: how much to take   oxyCODONE-acetaminophen 5-325 MG tablet Commonly known as: Percocet Take 1 tablet by mouth every 4 (four) hours as needed for severe pain.   Protonix 40 MG tablet Generic drug: pantoprazole Take 40 mg by mouth daily.       No Known Allergies  Consultations:     Procedures/Studies: CT ABDOMEN PELVIS W CONTRAST  Result Date: 04/25/2020 CLINICAL DATA:  39 year old male with abdominal pain, diarrhea and rectal bleeding times 2 days. Prior sigmoid diverticulitis. EXAM: CT ABDOMEN AND PELVIS WITH CONTRAST TECHNIQUE: Multidetector CT imaging of the abdomen and pelvis was performed using the standard protocol following bolus administration of intravenous contrast. CONTRAST:  OMNIPAQUE IOHEXOL 300 MG/ML  SOLN COMPARISON:  CT Abdomen and Pelvis 03/27/2017 and earlier. FINDINGS: Lower chest: Negative. Hepatobiliary: Chronically absent gallbladder. Liver is stable since 2014 and negative. Pancreas: Negative. Spleen: Negative. Adrenals/Urinary Tract: Normal adrenal glands. Symmetric bilateral renal enhancement. Delayed excretory images not provided. No nephrolithiasis or hydronephrosis. Ureters remain within normal limits. Urinary bladder abuts the abnormal sigmoid colon but remains within normal limits. Incidental pelvic phleboliths. Stomach/Bowel: Extensive diverticulosis throughout the large bowel, especially from  the mid transverse colon distally. Abnormal large bowel wall thickening beginning at the distal transverse colon and continuing throughout the descending and sigmoid colon. Inflammation is most pronounced in the distal sigmoid where  thickened diverticula are noted in addition to the generalized wall thickening and confluent mesenteric stranding occurs (coronal image 59). No extraluminal gas. No drainable fluid collection. Left pelvic side wall inflammatory stranding. Normal appendix. Negative terminal ileum. Oral contrast has not yet reached the small bowel. No dilated or inflamed small bowel. Decompressed and negative stomach. Small duodenal diverticulum in the midline (series 2, image 43) with no adverse features. Vascular/Lymphatic: Suboptimal intravascular contrast bolus but the major vascular structures in the abdomen and pelvis appear to be patent. No lymphadenopathy. Reproductive: Negative. Other: Trace free fluid in the pelvis. Musculoskeletal: Negative for age. No acute osseous abnormality identified. IMPRESSION: 1. Acute Colitis and/or Diverticulitis from the distal transverse colon through the sigmoid. Inflammation is maximal in the sigmoid colon with inflamed diverticula there. Trace free fluid but no perforation, abscess, or bowel obstruction. 2. No other acute or inflammatory process in the abdomen or pelvis. Electronically Signed   By: Odessa Fleming M.D.   On: 04/25/2020 09:44       Subjective: Abdominal pain resolved.  No vomiting.  Wants to go home.  Discharge Exam: Vitals:   04/26/20 0521 04/26/20 0611 04/26/20 0742 04/26/20 1213  BP: 119/72  114/68 138/82  Pulse: 81  74 75  Resp: 20  20   Temp: 98.5 F (36.9 C)  98.3 F (36.8 C) 97.8 F (36.6 C)  TempSrc: Oral   Oral  SpO2: 93%  98% 98%  Weight:  (!) 145.4 kg    Height:  5\' 11"  (1.803 m)      General: Pt is alert, awake, not in acute distress Cardiovascular: RRR, S1/S2 +, no rubs, no gallops Respiratory: CTA bilaterally, no wheezing, no rhonchi Abdominal: Soft, NT, ND, bowel sounds + Extremities: no edema, no cyanosis    The results of significant diagnostics from this hospitalization (including imaging, microbiology, ancillary and laboratory) are  listed below for reference.     Microbiology: Recent Results (from the past 240 hour(s))  Resp Panel by RT-PCR (Flu A&B, Covid) Nasopharyngeal Swab     Status: None   Collection Time: 04/25/20  4:59 PM   Specimen: Nasopharyngeal Swab; Nasopharyngeal(NP) swabs in vial transport medium  Result Value Ref Range Status   SARS Coronavirus 2 by RT PCR NEGATIVE NEGATIVE Final    Comment: (NOTE) SARS-CoV-2 target nucleic acids are NOT DETECTED.  The SARS-CoV-2 RNA is generally detectable in upper respiratory specimens during the acute phase of infection. The lowest concentration of SARS-CoV-2 viral copies this assay can detect is 138 copies/mL. A negative result does not preclude SARS-Cov-2 infection and should not be used as the sole basis for treatment or other patient management decisions. A negative result may occur with  improper specimen collection/handling, submission of specimen other than nasopharyngeal swab, presence of viral mutation(s) within the areas targeted by this assay, and inadequate number of viral copies(<138 copies/mL). A negative result must be combined with clinical observations, patient history, and epidemiological information. The expected result is Negative.  Fact Sheet for Patients:  04/27/20  Fact Sheet for Healthcare Providers:  BloggerCourse.com  This test is no t yet approved or cleared by the [place] FDA and  has been authorized for detection and/or diagnosis of SARS-CoV-2 by FDA under an Emergency Use Authorization (EUA). This EUA will remain  in  effect (meaning this test can be used) for the duration of the COVID-19 declaration under Section 564(b)(1) of the Act, 21 U.S.C.section 360bbb-3(b)(1), unless the authorization is terminated  or revoked sooner.       Influenza A by PCR NEGATIVE NEGATIVE Final   Influenza B by PCR NEGATIVE NEGATIVE Final    Comment: (NOTE) The Xpert Xpress  SARS-CoV-2/FLU/RSV plus assay is intended as an aid in the diagnosis of influenza from Nasopharyngeal swab specimens and should not be used as a sole basis for treatment. Nasal washings and aspirates are unacceptable for Xpert Xpress SARS-CoV-2/FLU/RSV testing.  Fact Sheet for Patients: BloggerCourse.com  Fact Sheet for Healthcare Providers: SeriousBroker.it  This test is not yet approved or cleared by the [place] FDA and has been authorized for detection and/or diagnosis of SARS-CoV-2 by FDA under an Emergency Use Authorization (EUA). This EUA will remain in effect (meaning this test can be used) for the duration of the COVID-19 declaration under Section 564(b)(1) of the Act, 21 U.S.C. section 360bbb-3(b)(1), unless the authorization is terminated or revoked.  Performed at [facility], [address] [place], [place] [address]   Blood culture (routine x 2)     Status: None (Preliminary result)   Collection Time: 04/25/20  5:00 PM   Specimen: BLOOD  Result Value Ref Range Status   Specimen Description BLOOD LEFT ANTECUBITAL  Final   Special Requests   Final    BOTTLES DRAWN AEROBIC AND ANAEROBIC Blood Culture adequate volume   Culture   Final    NO GROWTH < 24 HOURS Performed at [facility], [address] [place], [place] [address]    Report Status PENDING  Incomplete  Blood culture (routine x 2)     Status: None (Preliminary result)   Collection Time: 04/25/20  5:05 PM   Specimen: BLOOD  Result Value Ref Range Status   Specimen Description BLOOD RIGHT ANTECUBITAL  Final   Special Requests   Final    BOTTLES DRAWN AEROBIC AND ANAEROBIC Blood Culture adequate volume   Culture   Final    NO GROWTH < 24 HOURS Performed at [facility], [address], [place], [place] [address]    Report Status PENDING  Incomplete  C Difficile Quick Screen w PCR reflex     Status: None   Collection  Time: 04/25/20  5:30 PM   Specimen: STOOL  Result Value Ref Range Status   C Diff antigen NEGATIVE NEGATIVE Final   C Diff toxin NEGATIVE NEGATIVE Final   C Diff interpretation No C. difficile detected.  Final    Comment: Performed at [facility], [address] [place], [place] [address]  Gastrointestinal Panel by PCR , Stool     Status: None   Collection Time: 04/25/20  5:30 PM   Specimen: STOOL  Result Value Ref Range Status   Campylobacter species NOT DETECTED NOT DETECTED Final   Plesimonas shigelloides NOT DETECTED NOT DETECTED Final   Salmonella species NOT DETECTED NOT DETECTED Final   Yersinia enterocolitica NOT DETECTED NOT DETECTED Final   Vibrio species NOT DETECTED NOT DETECTED Final   Vibrio cholerae NOT DETECTED NOT DETECTED Final   Enteroaggregative E coli (EAEC) NOT DETECTED NOT DETECTED Final   Enteropathogenic E coli (EPEC) NOT DETECTED NOT DETECTED Final   Enterotoxigenic E coli (ETEC) NOT DETECTED NOT DETECTED Final   Shiga like toxin producing E coli (STEC) NOT DETECTED NOT DETECTED Final   Shigella/Enteroinvasive E coli (EIEC) NOT  DETECTED NOT DETECTED Final   Cryptosporidium NOT DETECTED NOT DETECTED Final   Cyclospora cayetanensis NOT DETECTED NOT DETECTED Final   Entamoeba histolytica NOT DETECTED NOT DETECTED Final   Giardia lamblia NOT DETECTED NOT DETECTED Final   Adenovirus F40/41 NOT DETECTED NOT DETECTED Final   Astrovirus NOT DETECTED NOT DETECTED Final   Norovirus GI/GII NOT DETECTED NOT DETECTED Final   Rotavirus A NOT DETECTED NOT DETECTED Final   Sapovirus (I, [name], IV, and V) NOT DETECTED NOT DETECTED Final    Comment: Performed at [facility], [address] [place], [place] [id number]     Labs: BNP (last 3 results) No results for input(s): BNP in the last 8760 hours. Basic Metabolic Panel: Recent Labs  Lab 04/25/20 0658 04/25/20 1402 04/26/20 0422  NA 138 137 137  K 3.6 3.6 3.3*  CL 106 105 105  CO2 25 23 25    GLUCOSE 157* 130* 87  BUN 16 15 12   CREATININE 0.73 0.65 0.72  CALCIUM 8.8* 8.6* 8.2*   Liver Function Tests: Recent Labs  Lab 04/25/20 0658 04/25/20 1402 04/26/20 0422  AST 25 29 19   ALT 41 44 33  ALKPHOS 104 96 81  BILITOT 0.9 1.0 1.3*  PROT 6.9 6.8 5.8*  ALBUMIN 3.9 3.9 3.2*   Recent Labs  Lab 04/25/20 0658 04/25/20 1402  LIPASE 33 26   No results for input(s): AMMONIA in the last 168 hours. CBC: Recent Labs  Lab 04/25/20 0658 04/25/20 1402 04/26/20 0422  WBC 11.1* 15.8* 10.7*  NEUTROABS 7.6  --   --   HGB 14.8 15.0 12.6*  HCT 43.4 44.0 37.7*  MCV 92.1 91.5 93.8  PLT 219 226 186   Cardiac Enzymes: No results for input(s): CKTOTAL, CKMB, CKMBINDEX, TROPONINI in the last 168 hours. BNP: Invalid input(s): POCBNP CBG: No results for input(s): GLUCAP in the last 168 hours. D-Dimer No results for input(s): DDIMER in the last 72 hours. Hgb A1c Recent Labs    04/25/20 1902  HGBA1C 5.7*   Lipid Profile No results for input(s): CHOL, HDL, LDLCALC, TRIG, CHOLHDL, LDLDIRECT in the last 72 hours. Thyroid function studies Recent Labs    04/25/20 1902  TSH 1.380   Anemia work up No results for input(s): VITAMINB12, FOLATE, FERRITIN, TIBC, IRON, RETICCTPCT in the last 72 hours. Urinalysis    Component Value Date/Time   COLORURINE YELLOW (A) 04/25/2020 0658   APPEARANCEUR CLEAR (A) 04/25/2020 0658   APPEARANCEUR Clear 09/08/2012 1620   LABSPEC 1.025 04/25/2020 0658   LABSPEC 1.020 09/08/2012 1620   PHURINE 5.0 04/25/2020 0658   GLUCOSEU NEGATIVE 04/25/2020 0658   GLUCOSEU Negative 09/08/2012 1620   HGBUR NEGATIVE 04/25/2020 0658   BILIRUBINUR NEGATIVE 04/25/2020 0658   BILIRUBINUR Negative 09/08/2012 1620   KETONESUR NEGATIVE 04/25/2020 0658   PROTEINUR NEGATIVE 04/25/2020 0658   NITRITE NEGATIVE 04/25/2020 0658   LEUKOCYTESUR TRACE (A) 04/25/2020 0658   LEUKOCYTESUR Negative 09/08/2012 1620   Sepsis Labs Invalid input(s): PROCALCITONIN,  WBC,   LACTICIDVEN Microbiology Recent Results (from the past 240 hour(s))  Resp Panel by RT-PCR (Flu A&B, Covid) Nasopharyngeal Swab     Status: None   Collection Time: 04/25/20  4:59 PM   Specimen: Nasopharyngeal Swab; Nasopharyngeal(NP) swabs in vial transport medium  Result Value Ref Range Status   SARS Coronavirus 2 by RT PCR NEGATIVE NEGATIVE Final    Comment: (NOTE) SARS-CoV-2 target nucleic acids are NOT DETECTED.  The SARS-CoV-2 RNA is generally detectable in upper respiratory specimens  during the acute phase of infection. The lowest concentration of SARS-CoV-2 viral copies this assay can detect is 138 copies/mL. A negative result does not preclude SARS-Cov-2 infection and should not be used as the sole basis for treatment or other patient management decisions. A negative result may occur with  improper specimen collection/handling, submission of specimen other than nasopharyngeal swab, presence of viral mutation(s) within the areas targeted by this assay, and inadequate number of viral copies(<138 copies/mL). A negative result must be combined with clinical observations, patient history, and epidemiological information. The expected result is Negative.  Fact Sheet for Patients:  BloggerCourse.com  Fact Sheet for Healthcare Providers:  SeriousBroker.it  This test is no t yet approved or cleared by the [place] FDA and  has been authorized for detection and/or diagnosis of SARS-CoV-2 by FDA under an Emergency Use Authorization (EUA). This EUA will remain  in effect (meaning this test can be used) for the duration of the COVID-19 declaration under Section 564(b)(1) of the Act, 21 U.S.C.section 360bbb-3(b)(1), unless the authorization is terminated  or revoked sooner.       Influenza A by PCR NEGATIVE NEGATIVE Final   Influenza B by PCR NEGATIVE NEGATIVE Final    Comment: (NOTE) The Xpert Xpress SARS-CoV-2/FLU/RSV plus  assay is intended as an aid in the diagnosis of influenza from Nasopharyngeal swab specimens and should not be used as a sole basis for treatment. Nasal washings and aspirates are unacceptable for Xpert Xpress SARS-CoV-2/FLU/RSV testing.  Fact Sheet for Patients: BloggerCourse.com  Fact Sheet for Healthcare Providers: SeriousBroker.it  This test is not yet approved or cleared by the [place] FDA and has been authorized for detection and/or diagnosis of SARS-CoV-2 by FDA under an Emergency Use Authorization (EUA). This EUA will remain in effect (meaning this test can be used) for the duration of the COVID-19 declaration under Section 564(b)(1) of the Act, 21 U.S.C. section 360bbb-3(b)(1), unless the authorization is terminated or revoked.  Performed at [facility], [address] [place], [place] [address]   Blood culture (routine x 2)     Status: None (Preliminary result)   Collection Time: 04/25/20  5:00 PM   Specimen: BLOOD  Result Value Ref Range Status   Specimen Description BLOOD LEFT ANTECUBITAL  Final   Special Requests   Final    BOTTLES DRAWN AEROBIC AND ANAEROBIC Blood Culture adequate volume   Culture   Final    NO GROWTH < 24 HOURS Performed at [facility], [address], [place], [place] [address]    Report Status PENDING  Incomplete  Blood culture (routine x 2)     Status: None (Preliminary result)   Collection Time: 04/25/20  5:05 PM   Specimen: BLOOD  Result Value Ref Range Status   Specimen Description BLOOD RIGHT ANTECUBITAL  Final   Special Requests   Final    BOTTLES DRAWN AEROBIC AND ANAEROBIC Blood Culture adequate volume   Culture   Final    NO GROWTH < 24 HOURS Performed at [facility], [address], [place], [place] [address]    Report Status PENDING  Incomplete  C Difficile Quick Screen w PCR reflex     Status: None   Collection Time: 04/25/20  5:30 PM    Specimen: STOOL  Result Value Ref Range Status   C Diff antigen NEGATIVE NEGATIVE Final   C Diff toxin NEGATIVE NEGATIVE Final   C Diff interpretation No C. difficile detected.  Final  Comment: Performed at [facility], [address] [place], [place] [address]  Gastrointestinal Panel by PCR , Stool     Status: None   Collection Time: 04/25/20  5:30 PM   Specimen: STOOL  Result Value Ref Range Status   Campylobacter species NOT DETECTED NOT DETECTED Final   Plesimonas shigelloides NOT DETECTED NOT DETECTED Final   Salmonella species NOT DETECTED NOT DETECTED Final   Yersinia enterocolitica NOT DETECTED NOT DETECTED Final   Vibrio species NOT DETECTED NOT DETECTED Final   Vibrio cholerae NOT DETECTED NOT DETECTED Final   Enteroaggregative E coli (EAEC) NOT DETECTED NOT DETECTED Final   Enteropathogenic E coli (EPEC) NOT DETECTED NOT DETECTED Final   Enterotoxigenic E coli (ETEC) NOT DETECTED NOT DETECTED Final   Shiga like toxin producing E coli (STEC) NOT DETECTED NOT DETECTED Final   Shigella/Enteroinvasive E coli (EIEC) NOT DETECTED NOT DETECTED Final   Cryptosporidium NOT DETECTED NOT DETECTED Final   Cyclospora cayetanensis NOT DETECTED NOT DETECTED Final   Entamoeba histolytica NOT DETECTED NOT DETECTED Final   Giardia lamblia NOT DETECTED NOT DETECTED Final   Adenovirus F40/41 NOT DETECTED NOT DETECTED Final   Astrovirus NOT DETECTED NOT DETECTED Final   Norovirus GI/GII NOT DETECTED NOT DETECTED Final   Rotavirus A NOT DETECTED NOT DETECTED Final   Sapovirus (I, [name], IV, and V) NOT DETECTED NOT DETECTED Final    Comment: Performed at [facility], [address], [place], [place] [address]     Time coordinating discharge:  SIGNED:   Erick Blinks, MD  [facility] 04/26/2020, 8:27 PM   If 7PM-7AM, please contact night-coverage [email or web address]

## 2020-04-26 NOTE — Plan of Care (Signed)
  Problem: Education: Goal: Knowledge of General Education information will improve Description: Including pain rating scale, medication(s)/side effects and non-pharmacologic comfort measures 04/26/2020 1120 by Ansel Bong, RN Outcome: Progressing 04/26/2020 1120 by Ansel Bong, RN Outcome: Progressing   Problem: Health Behavior/Discharge Planning: Goal: Ability to manage health-related needs will improve 04/26/2020 1120 by Ansel Bong, RN Outcome: Progressing 04/26/2020 1120 by Ansel Bong, RN Outcome: Progressing   Problem: Clinical Measurements: Goal: Ability to maintain clinical measurements within normal limits will improve 04/26/2020 1120 by Ansel Bong, RN Outcome: Progressing 04/26/2020 1120 by Ansel Bong, RN Outcome: Progressing Goal: Will remain free from infection 04/26/2020 1120 by Ansel Bong, RN Outcome: Progressing 04/26/2020 1120 by Ansel Bong, RN Outcome: Progressing Goal: Diagnostic test results will improve 04/26/2020 1120 by Ansel Bong, RN Outcome: Progressing 04/26/2020 1120 by Ansel Bong, RN Outcome: Progressing Goal: Respiratory complications will improve 04/26/2020 1120 by Ansel Bong, RN Outcome: Progressing 04/26/2020 1120 by Ansel Bong, RN Outcome: Progressing Goal: Cardiovascular complication will be avoided 04/26/2020 1120 by Ansel Bong, RN Outcome: Progressing 04/26/2020 1120 by Ansel Bong, RN Outcome: Progressing   Problem: Activity: Goal: Risk for activity intolerance will decrease 04/26/2020 1120 by Ansel Bong, RN Outcome: Progressing 04/26/2020 1120 by Ansel Bong, RN Outcome: Progressing   Problem: Nutrition: Goal: Adequate nutrition will be maintained 04/26/2020 1120 by Ansel Bong, RN Outcome: Progressing 04/26/2020 1120 by Ansel Bong, RN Outcome: Progressing   Problem: Coping: Goal: Level of anxiety will decrease 04/26/2020 1120 by Ansel Bong, RN Outcome:  Progressing 04/26/2020 1120 by Ansel Bong, RN Outcome: Progressing   Problem: Elimination: Goal: Will not experience complications related to bowel motility 04/26/2020 1120 by Ansel Bong, RN Outcome: Progressing 04/26/2020 1120 by Ansel Bong, RN Outcome: Progressing Goal: Will not experience complications related to urinary retention 04/26/2020 1120 by Ansel Bong, RN Outcome: Progressing 04/26/2020 1120 by Ansel Bong, RN Outcome: Progressing   Problem: Pain Managment: Goal: General experience of comfort will improve 04/26/2020 1120 by Ansel Bong, RN Outcome: Progressing 04/26/2020 1120 by Ansel Bong, RN Outcome: Progressing   Problem: Safety: Goal: Ability to remain free from injury will improve 04/26/2020 1120 by Ansel Bong, RN Outcome: Progressing 04/26/2020 1120 by Ansel Bong, RN Outcome: Progressing   Problem: Skin Integrity: Goal: Risk for impaired skin integrity will decrease 04/26/2020 1120 by Ansel Bong, RN Outcome: Progressing 04/26/2020 1120 by Ansel Bong, RN Outcome: Progressing

## 2020-04-30 LAB — CULTURE, BLOOD (ROUTINE X 2)
Culture: NO GROWTH
Culture: NO GROWTH
Special Requests: ADEQUATE
Special Requests: ADEQUATE

## 2020-05-01 ENCOUNTER — Ambulatory Visit: Payer: Medicaid Other | Admitting: Gastroenterology

## 2020-06-08 ENCOUNTER — Other Ambulatory Visit: Payer: Medicaid Other | Attending: Internal Medicine

## 2020-06-12 ENCOUNTER — Encounter: Admission: RE | Payer: Self-pay | Source: Home / Self Care

## 2020-06-12 ENCOUNTER — Ambulatory Visit: Admission: RE | Admit: 2020-06-12 | Payer: Medicaid Other | Source: Home / Self Care | Admitting: Internal Medicine

## 2020-06-12 SURGERY — COLONOSCOPY WITH PROPOFOL
Anesthesia: General

## 2021-01-23 ENCOUNTER — Other Ambulatory Visit: Payer: Self-pay | Admitting: Surgery

## 2021-01-23 DIAGNOSIS — M1711 Unilateral primary osteoarthritis, right knee: Secondary | ICD-10-CM

## 2021-01-31 ENCOUNTER — Ambulatory Visit
Admission: RE | Admit: 2021-01-31 | Discharge: 2021-01-31 | Disposition: A | Discharge status: Home / Self Care | Payer: Medicaid Other | Source: Ambulatory Visit | Attending: Surgery | Admitting: Surgery

## 2021-01-31 ENCOUNTER — Other Ambulatory Visit: Payer: Self-pay

## 2021-01-31 DIAGNOSIS — M1711 Unilateral primary osteoarthritis, right knee: Secondary | ICD-10-CM | POA: Insufficient documentation

## 2021-03-13 ENCOUNTER — Emergency Department (HOSPITAL_COMMUNITY): Payer: Medicaid Other

## 2021-03-13 ENCOUNTER — Encounter (HOSPITAL_COMMUNITY): Payer: Self-pay

## 2021-03-13 ENCOUNTER — Other Ambulatory Visit: Payer: Self-pay

## 2021-03-13 ENCOUNTER — Emergency Department (HOSPITAL_COMMUNITY)
Admission: EM | Admit: 2021-03-13 | Discharge: 2021-03-13 | Disposition: A | Discharge status: Home / Self Care | Payer: Medicaid Other | Attending: Emergency Medicine | Admitting: Emergency Medicine

## 2021-03-13 DIAGNOSIS — W228XXA Striking against or struck by other objects, initial encounter: Secondary | ICD-10-CM | POA: Diagnosis not present

## 2021-03-13 DIAGNOSIS — S0990XA Unspecified injury of head, initial encounter: Secondary | ICD-10-CM | POA: Diagnosis present

## 2021-03-13 DIAGNOSIS — Z23 Encounter for immunization: Secondary | ICD-10-CM | POA: Diagnosis not present

## 2021-03-13 DIAGNOSIS — S01112A Laceration without foreign body of left eyelid and periocular area, initial encounter: Secondary | ICD-10-CM

## 2021-03-13 DIAGNOSIS — J45909 Unspecified asthma, uncomplicated: Secondary | ICD-10-CM | POA: Insufficient documentation

## 2021-03-13 DIAGNOSIS — S022XXA Fracture of nasal bones, initial encounter for closed fracture: Secondary | ICD-10-CM

## 2021-03-13 DIAGNOSIS — S0181XA Laceration without foreign body of other part of head, initial encounter: Secondary | ICD-10-CM | POA: Insufficient documentation

## 2021-03-13 DIAGNOSIS — Z79899 Other long term (current) drug therapy: Secondary | ICD-10-CM | POA: Diagnosis not present

## 2021-03-13 DIAGNOSIS — I10 Essential (primary) hypertension: Secondary | ICD-10-CM | POA: Diagnosis not present

## 2021-03-13 DIAGNOSIS — Y990 Civilian activity done for income or pay: Secondary | ICD-10-CM | POA: Insufficient documentation

## 2021-03-13 DIAGNOSIS — S0219XB Other fracture of base of skull, initial encounter for open fracture: Secondary | ICD-10-CM | POA: Insufficient documentation

## 2021-03-13 DIAGNOSIS — F17210 Nicotine dependence, cigarettes, uncomplicated: Secondary | ICD-10-CM | POA: Diagnosis not present

## 2021-03-13 MED ORDER — OXYCODONE-ACETAMINOPHEN 5-325 MG PO TABS: 1.0000 | ORAL_TABLET | ORAL | 0 refills | Status: DC | PRN

## 2021-03-13 MED ORDER — TETANUS-DIPHTH-ACELL PERTUSSIS 5-2.5-18.5 LF-MCG/0.5 IM SUSY
0.5000 mL | PREFILLED_SYRINGE | Freq: Once | INTRAMUSCULAR | Status: AC
  Administered 2021-03-13: 0.5 mL via INTRAMUSCULAR
  Filled 2021-03-13: qty 0.5

## 2021-03-13 MED ORDER — ONDANSETRON 4 MG PO TBDP
8.0000 mg | ORAL_TABLET | Freq: Once | ORAL | Status: AC
  Administered 2021-03-13: 8 mg via ORAL
  Filled 2021-03-13: qty 2

## 2021-03-13 MED ORDER — LIDOCAINE HCL (PF) 1 % IJ SOLN
10.0000 mL | Freq: Once | INTRAMUSCULAR | Status: AC
  Administered 2021-03-13: 10 mL
  Filled 2021-03-13: qty 10

## 2021-03-13 MED ORDER — OXYCODONE-ACETAMINOPHEN 5-325 MG PO TABS
1.0000 | ORAL_TABLET | Freq: Once | ORAL | Status: AC
  Administered 2021-03-13: 1 via ORAL
  Filled 2021-03-13: qty 1

## 2021-03-13 MED ORDER — OXYCODONE-ACETAMINOPHEN 5-325 MG PO TABS
1.0000 | ORAL_TABLET | Freq: Once | ORAL | Status: AC
Start: 2021-03-13 — End: 2021-03-13
  Administered 2021-03-13: 1 via ORAL
  Filled 2021-03-13: qty 1

## 2021-03-13 NOTE — ED Provider Notes (Signed)
Signout from [employer].  40 year old male blunt trauma to his face.  Laceration to forehead.  Sinus fracture.  Nasal fracture.  He is pending evaluation by ENT.  Disposition per ENT recommendations. Physical Exam  BP (!) 159/111 (BP Location: Right Wrist)   Pulse 81   Temp 97.6 F (36.4 C) (Oral)   Resp 18   Ht 5\' 11"  (1.803 m)   Wt (!) 147.4 kg   SpO2 98%   BMI 45.33 kg/m   Physical Exam  ED Course/Procedures     Procedures  MDM  Patient seen by ENT.  They are recommending outpatient follow-up in the clinic.  No antibiotics.  Reviewed discharge instructions with patient.  Return instructions discussed       , MD 03/14/21 [phone]

## 2021-03-13 NOTE — ED Triage Notes (Signed)
[facility] states pt was at work using a breaker bar and it snapped back and hit him in the face. Large laceration above left eye and possible broken nose, bleeding controlled with EMS. Denies any LOC.
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
## 2021-03-13 NOTE — Discharge Instructions (Addendum)
You were seen in the emergency department for evaluation of injuries to your face.  You had a laceration that was sutured and these will need to be removed in 5 to 7 days.  You also had a nasal fracture and sinus fracture and these were evaluated by ear nose throat.  They are recommending follow-up in the clinic in about 7 days for reevaluation.  You can use ice to your wounds.   Please do not forcefully blow your nose.  Return if any signs of infection or other concerns.
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
## 2021-04-06 ENCOUNTER — Other Ambulatory Visit: Payer: Self-pay | Admitting: Family Medicine

## 2021-04-06 DIAGNOSIS — S0219XD Other fracture of base of skull, subsequent encounter for fracture with routine healing: Secondary | ICD-10-CM

## 2021-04-06 DIAGNOSIS — S022XXD Fracture of nasal bones, subsequent encounter for fracture with routine healing: Secondary | ICD-10-CM

## 2021-04-12 ENCOUNTER — Other Ambulatory Visit: Payer: Self-pay | Admitting: Surgery

## 2021-04-16 ENCOUNTER — Other Ambulatory Visit: Payer: Medicaid Other

## 2021-04-17 ENCOUNTER — Ambulatory Visit: Payer: Medicaid Other | Attending: Family Medicine

## 2021-04-17 ENCOUNTER — Ambulatory Visit: Payer: Medicaid Other

## 2021-04-20 ENCOUNTER — Other Ambulatory Visit: Payer: Medicaid Other

## 2021-04-24 ENCOUNTER — Ambulatory Visit (HOSPITAL_COMMUNITY)
Admission: RE | Admit: 2021-04-24 | Discharge: 2021-04-24 | Disposition: A | Discharge status: Home / Self Care | Payer: Medicaid Other | Source: Ambulatory Visit | Attending: Family Medicine | Admitting: Family Medicine

## 2021-04-24 ENCOUNTER — Other Ambulatory Visit: Payer: Self-pay

## 2021-04-24 DIAGNOSIS — S022XXD Fracture of nasal bones, subsequent encounter for fracture with routine healing: Secondary | ICD-10-CM | POA: Diagnosis not present

## 2021-04-24 DIAGNOSIS — S0219XD Other fracture of base of skull, subsequent encounter for fracture with routine healing: Secondary | ICD-10-CM | POA: Diagnosis present

## 2021-04-27 ENCOUNTER — Other Ambulatory Visit: Payer: Medicaid Other

## 2021-05-01 ENCOUNTER — Ambulatory Visit: Admit: 2021-05-01 | Payer: Medicaid Other | Admitting: Surgery

## 2021-05-01 SURGERY — ARTHROPLASTY, KNEE, TOTAL
Anesthesia: Choice | Site: Knee | Laterality: Right

## 2021-10-03 ENCOUNTER — Other Ambulatory Visit: Payer: Self-pay | Admitting: Surgery

## 2021-10-03 DIAGNOSIS — M1711 Unilateral primary osteoarthritis, right knee: Secondary | ICD-10-CM

## 2021-10-04 ENCOUNTER — Ambulatory Visit
Admission: RE | Admit: 2021-10-04 | Discharge: 2021-10-04 | Disposition: A | Discharge status: Home / Self Care | Payer: Medicaid Other | Source: Ambulatory Visit | Attending: Surgery | Admitting: Surgery

## 2021-10-04 DIAGNOSIS — M1711 Unilateral primary osteoarthritis, right knee: Secondary | ICD-10-CM

## 2021-10-19 ENCOUNTER — Other Ambulatory Visit: Payer: Self-pay | Admitting: Surgery

## 2021-11-01 ENCOUNTER — Encounter
Admission: RE | Admit: 2021-11-01 | Discharge: 2021-11-01 | Disposition: A | Discharge status: Home / Self Care | Payer: Medicaid Other | Source: Ambulatory Visit | Attending: Surgery | Admitting: Surgery

## 2021-11-01 ENCOUNTER — Other Ambulatory Visit: Payer: Self-pay

## 2021-11-01 VITALS — BP 185/86 | HR 93 | Temp 98.4°F | Resp 20 | Ht 72.0 in | Wt 333.4 lb

## 2021-11-01 DIAGNOSIS — Z01818 Encounter for other preprocedural examination: Secondary | ICD-10-CM | POA: Diagnosis present

## 2021-11-01 DIAGNOSIS — Z01810 Encounter for preprocedural cardiovascular examination: Secondary | ICD-10-CM | POA: Diagnosis not present

## 2021-11-01 HISTORY — DX: Anxiety disorder, unspecified: F41.9

## 2021-11-01 LAB — CBC WITH DIFFERENTIAL/PLATELET
Abs Immature Granulocytes: 0.02 10*3/uL (ref 0.00–0.07)
Basophils Absolute: 0.1 10*3/uL (ref 0.0–0.1)
Basophils Relative: 1 %
Eosinophils Absolute: 0.6 10*3/uL — ABNORMAL HIGH (ref 0.0–0.5)
Eosinophils Relative: 8 %
HCT: 44.3 % (ref 39.0–52.0)
Hemoglobin: 15.1 g/dL (ref 13.0–17.0)
Immature Granulocytes: 0 %
Lymphocytes Relative: 24 %
Lymphs Abs: 1.8 10*3/uL (ref 0.7–4.0)
MCH: 30.6 pg (ref 26.0–34.0)
MCHC: 34.1 g/dL (ref 30.0–36.0)
MCV: 89.9 fL (ref 80.0–100.0)
Monocytes Absolute: 0.6 10*3/uL (ref 0.1–1.0)
Monocytes Relative: 8 %
Neutro Abs: 4.5 10*3/uL (ref 1.7–7.7)
Neutrophils Relative %: 59 %
Platelets: 210 10*3/uL (ref 150–400)
RBC: 4.93 MIL/uL (ref 4.22–5.81)
RDW: 12.2 % (ref 11.5–15.5)
WBC: 7.6 10*3/uL (ref 4.0–10.5)
nRBC: 0 % (ref 0.0–0.2)

## 2021-11-01 LAB — COMPREHENSIVE METABOLIC PANEL
ALT: 79 U/L — ABNORMAL HIGH (ref 0–44)
AST: 52 U/L — ABNORMAL HIGH (ref 15–41)
Albumin: 4.2 g/dL (ref 3.5–5.0)
Alkaline Phosphatase: 103 U/L (ref 38–126)
Anion gap: 7 (ref 5–15)
BUN: 14 mg/dL (ref 6–20)
CO2: 27 mmol/L (ref 22–32)
Calcium: 9.3 mg/dL (ref 8.9–10.3)
Chloride: 106 mmol/L (ref 98–111)
Creatinine, Ser: 0.91 mg/dL (ref 0.61–1.24)
GFR, Estimated: >60 mL/min (ref >60)
Glucose, Bld: 124 mg/dL — ABNORMAL HIGH (ref 70–99)
Potassium: 3.8 mmol/L (ref 3.5–5.1)
Sodium: 140 mmol/L (ref 135–145)
Total Bilirubin: 1.2 mg/dL (ref 0.3–1.2)
Total Protein: 7.4 g/dL (ref 6.5–8.1)

## 2021-11-01 LAB — URINALYSIS, ROUTINE W REFLEX MICROSCOPIC
Bilirubin Urine: NEGATIVE
Glucose, UA: NEGATIVE mg/dL
Hgb urine dipstick: NEGATIVE
Ketones, ur: NEGATIVE mg/dL
Leukocytes,Ua: NEGATIVE
Nitrite: NEGATIVE
Protein, ur: NEGATIVE mg/dL
Specific Gravity, Urine: 1.029 (ref 1.005–1.030)
pH: 5 (ref 5.0–8.0)

## 2021-11-01 LAB — TYPE AND SCREEN
ABO/RH(D): A POS
Antibody Screen: NEGATIVE

## 2021-11-01 LAB — SURGICAL PCR SCREEN
MRSA, PCR: NEGATIVE
Staphylococcus aureus: POSITIVE — AB

## 2021-11-01 NOTE — Patient Instructions (Signed)
Your procedure is scheduled on: 11/13/2021  Report to the Registration Desk on the 1st floor of the [facility]. To find out your arrival time, please call [phone] between 1PM - 3PM on: 11/12/2021  If your arrival time is 6:00 am, do not arrive prior to that time as the [facility] entrance doors do not open until 6:00 am.  REMEMBER: Instructions that are not followed completely may result in serious medical risk, up to and including death; or upon the discretion of your surgeon and anesthesiologist your surgery may need to be rescheduled.  Do not eat food after midnight the night before surgery.  No gum chewing, lozengers or hard candies.  You may however, drink CLEAR liquids up to 2 hours before you are scheduled to arrive for your surgery. Do not drink anything within 2 hours of your scheduled arrival time.  Clear liquids include: - water  - apple juice without pulp - gatorade (not RED colors)  Do NOT drink anything that is not on this list.    TAKE THESE MEDICATIONS THE MORNING OF SURGERY WITH A SIP OF WATER: amLODipine (NORVASC) 2. pantoprazole (PROTONIX) -(take one the night before and one on the morning of surgery - helps to prevent nausea after surgery.)   One week prior to surgery: Stop Anti-inflammatories (NSAIDS) such as Advil, Aleve, Ibuprofen, Motrin, Naproxen, Naprosyn and Aspirin based products such as Excedrin, Goodys Powder, BC Powder. Stop ANY OVER THE COUNTER supplements until after surgery. You may however, continue to take Tylenol if needed for pain up until the day of surgery.  No Alcohol for 24 hours before or after surgery.  No Smoking including e-cigarettes for 24 hours prior to surgery.  No chewable tobacco products for at least 6 hours prior to surgery.  No nicotine patches on the day of surgery.  Do not use any "recreational" drugs for at least a week prior to your surgery.  Please be advised that the combination of cocaine and anesthesia may  have negative outcomes, up to and including death. If you test positive for cocaine, your surgery will be cancelled.  On the morning of surgery brush your teeth with toothpaste and water, you may rinse your mouth with mouthwash if you wish. Do not swallow any toothpaste or mouthwash.  Use CHG Soap as directed on instruction sheet- provided for you  Do not wear jewelry, make-up, hairpins, clips or nail polish.  Do not wear lotions, powders, or perfumes.   Do not shave body from the neck down 48 hours prior to surgery just in case you cut yourself which could leave a site for infection.  Also, freshly shaved skin may become irritated if using the CHG soap.  Contact lenses, hearing aids and dentures may not be worn into surgery.  Do not bring valuables to the hospital. [facility] is not responsible for any missing/lost belongings or valuables.    Notify your doctor if there is any change in your medical condition (cold, fever, infection).  Wear comfortable clothing (specific to your surgery type) to the hospital.  After surgery, you can help prevent lung complications by doing breathing exercises.  Take deep breaths and cough every 1-2 hours. Your doctor may order a device called an Incentive Spirometer to help you take deep breaths.   If you are being admitted to the hospital overnight, leave your suitcase in the car. After surgery it may be brought to your room.  If you are being discharged the day of surgery,  you will not be allowed to drive home. You will need a responsible adult (18 years or older) to drive you home and stay with you that night.   If you are taking public transportation, you will need to have a responsible adult (18 years or older) with you. Please confirm with your physician that it is acceptable to use public transportation.   Please call the Pre-admissions Testing Dept. at [phone] if you have any questions about these instructions.  Surgery  Visitation Policy:  Patients undergoing a surgery or procedure may have two family members or support persons with them as long as the person is not COVID-19 positive or experiencing its symptoms.   Inpatient Visitation:    Visiting hours are 7 a.m. to 8 p.m. Up to four visitors are allowed at one time in a patient room, including children. The visitors may rotate out with other people during the day. One designated support person (adult) may remain overnight.

## 2021-11-12 MED ORDER — CEFAZOLIN IN SODIUM CHLORIDE 3-0.9 GM/100ML-% IV SOLN
3.0000 g | INTRAVENOUS | Status: AC
  Administered 2021-11-13: 3 g via INTRAVENOUS
  Filled 2021-11-12: qty 100

## 2021-11-13 ENCOUNTER — Ambulatory Visit: Payer: Medicaid Other | Admitting: Certified Registered Nurse Anesthetist

## 2021-11-13 ENCOUNTER — Encounter: Payer: Self-pay | Admitting: Surgery

## 2021-11-13 ENCOUNTER — Other Ambulatory Visit: Payer: Self-pay

## 2021-11-13 ENCOUNTER — Encounter
Admission: RE | Disposition: A | Discharge status: Home / Self Care | Payer: Self-pay | Source: Home / Self Care | Attending: Surgery

## 2021-11-13 ENCOUNTER — Ambulatory Visit: Payer: Medicaid Other

## 2021-11-13 ENCOUNTER — Ambulatory Visit
Admission: RE | Admit: 2021-11-13 | Discharge: 2021-11-13 | Disposition: A | Discharge status: Home / Self Care | Payer: Medicaid Other | Attending: Surgery | Admitting: Surgery

## 2021-11-13 DIAGNOSIS — I10 Essential (primary) hypertension: Secondary | ICD-10-CM | POA: Diagnosis not present

## 2021-11-13 DIAGNOSIS — F17200 Nicotine dependence, unspecified, uncomplicated: Secondary | ICD-10-CM | POA: Insufficient documentation

## 2021-11-13 DIAGNOSIS — K219 Gastro-esophageal reflux disease without esophagitis: Secondary | ICD-10-CM | POA: Diagnosis not present

## 2021-11-13 DIAGNOSIS — J45909 Unspecified asthma, uncomplicated: Secondary | ICD-10-CM | POA: Diagnosis not present

## 2021-11-13 DIAGNOSIS — F419 Anxiety disorder, unspecified: Secondary | ICD-10-CM | POA: Diagnosis not present

## 2021-11-13 DIAGNOSIS — Z8616 Personal history of COVID-19: Secondary | ICD-10-CM | POA: Insufficient documentation

## 2021-11-13 DIAGNOSIS — M1711 Unilateral primary osteoarthritis, right knee: Secondary | ICD-10-CM | POA: Insufficient documentation

## 2021-11-13 DIAGNOSIS — M7121 Synovial cyst of popliteal space [Baker], right knee: Secondary | ICD-10-CM | POA: Insufficient documentation

## 2021-11-13 DIAGNOSIS — Z79899 Other long term (current) drug therapy: Secondary | ICD-10-CM | POA: Insufficient documentation

## 2021-11-13 DIAGNOSIS — Z8261 Family history of arthritis: Secondary | ICD-10-CM | POA: Diagnosis not present

## 2021-11-13 DIAGNOSIS — Z6841 Body Mass Index (BMI) 40.0 and over, adult: Secondary | ICD-10-CM | POA: Insufficient documentation

## 2021-11-13 DIAGNOSIS — M2341 Loose body in knee, right knee: Secondary | ICD-10-CM | POA: Insufficient documentation

## 2021-11-13 HISTORY — PX: TOTAL KNEE ARTHROPLASTY: SHX125

## 2021-11-13 SURGERY — ARTHROPLASTY, KNEE, TOTAL
Anesthesia: Spinal | Site: Knee | Laterality: Right

## 2021-11-13 MED ORDER — OXYCODONE HCL 5 MG PO TABS: 5.0000 mg | ORAL_TABLET | Freq: Once | ORAL | Status: DC | PRN

## 2021-11-13 MED ORDER — FENTANYL CITRATE (PF) 100 MCG/2ML IJ SOLN
INTRAMUSCULAR | Status: AC
  Filled 2021-11-13: qty 2

## 2021-11-13 MED ORDER — FENTANYL CITRATE (PF) 100 MCG/2ML IJ SOLN
INTRAMUSCULAR | Status: DC | PRN
Start: 2021-11-13 — End: 2021-11-13
  Administered 2021-11-13: 25 ug via INTRAVENOUS
  Administered 2021-11-13: 50 ug via INTRAVENOUS
  Administered 2021-11-13: 25 ug via INTRAVENOUS

## 2021-11-13 MED ORDER — KETAMINE HCL 10 MG/ML IJ SOLN
INTRAMUSCULAR | Status: DC | PRN
  Administered 2021-11-13: 30 mg via INTRAVENOUS

## 2021-11-13 MED ORDER — KETOROLAC TROMETHAMINE 30 MG/ML IJ SOLN
INTRAMUSCULAR | Status: DC | PRN
  Administered 2021-11-13: 30 mg via INTRAMUSCULAR

## 2021-11-13 MED ORDER — TRANEXAMIC ACID 1000 MG/10ML IV SOLN
INTRAVENOUS | Status: DC | PRN
  Administered 2021-11-13: 1000 mg via TOPICAL

## 2021-11-13 MED ORDER — HYDROMORPHONE HCL 1 MG/ML IJ SOLN
INTRAMUSCULAR | Status: AC
  Filled 2021-11-13: qty 0.5

## 2021-11-13 MED ORDER — ARTIFICIAL TEARS OPHTHALMIC OINT
TOPICAL_OINTMENT | OPHTHALMIC | Status: DC | PRN
  Administered 2021-11-13: 1 via OPHTHALMIC

## 2021-11-13 MED ORDER — ACETAMINOPHEN 10 MG/ML IV SOLN: 1000.0000 mg | Freq: Once | INTRAVENOUS | Status: DC | PRN

## 2021-11-13 MED ORDER — BUPIVACAINE-EPINEPHRINE (PF) 0.5% -1:200000 IJ SOLN
INTRAMUSCULAR | Status: DC | PRN
  Administered 2021-11-13: 30 mL

## 2021-11-13 MED ORDER — OXYCODONE HCL 5 MG PO TABS
ORAL_TABLET | ORAL | Status: AC
  Filled 2021-11-13: qty 3

## 2021-11-13 MED ORDER — ACETAMINOPHEN 500 MG PO TABS
ORAL_TABLET | ORAL | Status: AC
  Administered 2021-11-13: 1000 mg via ORAL
  Filled 2021-11-13: qty 2

## 2021-11-13 MED ORDER — ACETAMINOPHEN 10 MG/ML IV SOLN
INTRAVENOUS | Status: AC
  Filled 2021-11-13: qty 100

## 2021-11-13 MED ORDER — GLYCOPYRROLATE 0.2 MG/ML IJ SOLN
INTRAMUSCULAR | Status: DC | PRN
  Administered 2021-11-13: .2 mg via INTRAVENOUS

## 2021-11-13 MED ORDER — KETOROLAC TROMETHAMINE 30 MG/ML IJ SOLN: 30.0000 mg | Freq: Once | INTRAMUSCULAR | Status: AC

## 2021-11-13 MED ORDER — APIXABAN 5 MG PO TABS: 5.0000 mg | ORAL_TABLET | Freq: Two times a day (BID) | ORAL | 0 refills | Status: DC

## 2021-11-13 MED ORDER — ONDANSETRON HCL 4 MG/2ML IJ SOLN: 4.0000 mg | Freq: Four times a day (QID) | INTRAMUSCULAR | Status: DC | PRN

## 2021-11-13 MED ORDER — OXYCODONE HCL 5 MG/5ML PO SOLN: 5.0000 mg | Freq: Once | ORAL | Status: DC | PRN

## 2021-11-13 MED ORDER — ORAL CARE MOUTH RINSE: 15.0000 mL | Freq: Once | OROMUCOSAL | Status: AC

## 2021-11-13 MED ORDER — CHLORHEXIDINE GLUCONATE 0.12 % MT SOLN
OROMUCOSAL | Status: AC
  Administered 2021-11-13: 15 mL via OROMUCOSAL
  Filled 2021-11-13: qty 15

## 2021-11-13 MED ORDER — GLYCOPYRROLATE 0.2 MG/ML IJ SOLN
INTRAMUSCULAR | Status: AC
  Filled 2021-11-13: qty 1

## 2021-11-13 MED ORDER — PROPOFOL 1000 MG/100ML IV EMUL
INTRAVENOUS | Status: AC
  Filled 2021-11-13: qty 100

## 2021-11-13 MED ORDER — PHENYLEPHRINE HCL-NACL 20-0.9 MG/250ML-% IV SOLN
INTRAVENOUS | Status: AC
  Filled 2021-11-13: qty 250

## 2021-11-13 MED ORDER — CEFAZOLIN IN SODIUM CHLORIDE 3-0.9 GM/100ML-% IV SOLN
3.0000 g | Freq: Four times a day (QID) | INTRAVENOUS | Status: DC
  Administered 2021-11-13: 3 g via INTRAVENOUS

## 2021-11-13 MED ORDER — 0.9 % SODIUM CHLORIDE (POUR BTL) OPTIME
TOPICAL | Status: DC | PRN
  Administered 2021-11-13: 4000 mL

## 2021-11-13 MED ORDER — ARTIFICIAL TEARS OPHTHALMIC OINT
TOPICAL_OINTMENT | OPHTHALMIC | Status: AC
  Filled 2021-11-13: qty 3.5

## 2021-11-13 MED ORDER — METOCLOPRAMIDE HCL 10 MG PO TABS: 5.0000 mg | ORAL_TABLET | Freq: Three times a day (TID) | ORAL | Status: DC | PRN

## 2021-11-13 MED ORDER — KETOROLAC TROMETHAMINE 15 MG/ML IJ SOLN: 15.0000 mg | Freq: Four times a day (QID) | INTRAMUSCULAR | Status: DC

## 2021-11-13 MED ORDER — ACETAMINOPHEN 500 MG PO TABS: 1000.0000 mg | ORAL_TABLET | Freq: Four times a day (QID) | ORAL | Status: DC

## 2021-11-13 MED ORDER — BUPIVACAINE LIPOSOME 1.3 % IJ SUSP
INTRAMUSCULAR | Status: AC
  Filled 2021-11-13: qty 20

## 2021-11-13 MED ORDER — SODIUM CHLORIDE 0.9 % IV SOLN
INTRAVENOUS | Status: DC | PRN
  Administered 2021-11-13: 60 mL

## 2021-11-13 MED ORDER — PROPOFOL 10 MG/ML IV BOLUS
INTRAVENOUS | Status: AC
  Filled 2021-11-13: qty 20

## 2021-11-13 MED ORDER — OXYCODONE HCL 5 MG PO TABS
10.0000 mg | ORAL_TABLET | ORAL | Status: DC | PRN
  Administered 2021-11-13: 15 mg via ORAL

## 2021-11-13 MED ORDER — PHENYLEPHRINE HCL (PRESSORS) 10 MG/ML IV SOLN
INTRAVENOUS | Status: DC | PRN
  Administered 2021-11-13: 80 ug via INTRAVENOUS
  Administered 2021-11-13: 40 ug via INTRAVENOUS

## 2021-11-13 MED ORDER — SODIUM CHLORIDE FLUSH 0.9 % IV SOLN
INTRAVENOUS | Status: AC
  Filled 2021-11-13: qty 40

## 2021-11-13 MED ORDER — BUPIVACAINE HCL (PF) 0.5 % IJ SOLN
INTRAMUSCULAR | Status: AC
  Filled 2021-11-13: qty 10

## 2021-11-13 MED ORDER — ONDANSETRON HCL 4 MG PO TABS: 4.0000 mg | ORAL_TABLET | Freq: Four times a day (QID) | ORAL | Status: DC | PRN

## 2021-11-13 MED ORDER — FENTANYL CITRATE (PF) 100 MCG/2ML IJ SOLN: 25.0000 ug | INTRAMUSCULAR | Status: DC | PRN

## 2021-11-13 MED ORDER — KETOROLAC TROMETHAMINE 30 MG/ML IJ SOLN
INTRAMUSCULAR | Status: AC
  Administered 2021-11-13: 30 mg via INTRAVENOUS
  Filled 2021-11-13: qty 1

## 2021-11-13 MED ORDER — TRIAMCINOLONE ACETONIDE 40 MG/ML IJ SUSP
INTRAMUSCULAR | Status: DC | PRN
  Administered 2021-11-13: 80 mg

## 2021-11-13 MED ORDER — HYDROMORPHONE HCL 1 MG/ML IJ SOLN
0.5000 mg | INTRAMUSCULAR | Status: DC | PRN
  Administered 2021-11-13: 0.5 mg via INTRAVENOUS

## 2021-11-13 MED ORDER — CEFAZOLIN IN SODIUM CHLORIDE 3-0.9 GM/100ML-% IV SOLN
3.0000 g | Freq: Four times a day (QID) | INTRAVENOUS | Status: DC
  Filled 2021-11-13: qty 100

## 2021-11-13 MED ORDER — CELECOXIB 200 MG PO CAPS: 200.0000 mg | ORAL_CAPSULE | Freq: Two times a day (BID) | ORAL | 2 refills | Status: AC

## 2021-11-13 MED ORDER — KETAMINE HCL 50 MG/5ML IJ SOSY
PREFILLED_SYRINGE | INTRAMUSCULAR | Status: AC
  Filled 2021-11-13: qty 5

## 2021-11-13 MED ORDER — HYDROMORPHONE HCL 1 MG/ML IJ SOLN
INTRAMUSCULAR | Status: AC
  Administered 2021-11-13: 0.5 mg via INTRAVENOUS
  Filled 2021-11-13: qty 0.5

## 2021-11-13 MED ORDER — SODIUM CHLORIDE 0.9 % BOLUS PEDS
250.0000 mL | Freq: Once | INTRAVENOUS | Status: AC
  Administered 2021-11-13: 250 mL via INTRAVENOUS

## 2021-11-13 MED ORDER — ONDANSETRON HCL 4 MG/2ML IJ SOLN: 4.0000 mg | Freq: Once | INTRAMUSCULAR | Status: DC | PRN

## 2021-11-13 MED ORDER — LISINOPRIL 40 MG PO TABS: 60.0000 mg | ORAL_TABLET | Freq: Every day | ORAL | Status: DC

## 2021-11-13 MED ORDER — MIDAZOLAM HCL 2 MG/2ML IJ SOLN
INTRAMUSCULAR | Status: AC
  Filled 2021-11-13: qty 2

## 2021-11-13 MED ORDER — OXYCODONE HCL 5 MG PO TABS: 5.0000 mg | ORAL_TABLET | ORAL | 0 refills | Status: DC | PRN

## 2021-11-13 MED ORDER — BUPIVACAINE HCL (PF) 0.5 % IJ SOLN
INTRAMUSCULAR | Status: DC | PRN
  Administered 2021-11-13: 2.6 mL via INTRATHECAL

## 2021-11-13 MED ORDER — PHENYLEPHRINE HCL-NACL 20-0.9 MG/250ML-% IV SOLN
INTRAVENOUS | Status: DC | PRN
  Administered 2021-11-13: 25 ug/min via INTRAVENOUS

## 2021-11-13 MED ORDER — CHLORHEXIDINE GLUCONATE 0.12 % MT SOLN: 15.0000 mL | Freq: Once | OROMUCOSAL | Status: AC

## 2021-11-13 MED ORDER — PROPOFOL 500 MG/50ML IV EMUL
INTRAVENOUS | Status: DC | PRN
  Administered 2021-11-13: 50 ug/kg/min via INTRAVENOUS

## 2021-11-13 MED ORDER — METOCLOPRAMIDE HCL 5 MG/ML IJ SOLN: 5.0000 mg | Freq: Three times a day (TID) | INTRAMUSCULAR | Status: DC | PRN

## 2021-11-13 MED ORDER — LACTATED RINGERS IV SOLN: INTRAVENOUS | Status: DC

## 2021-11-13 MED ORDER — KETOROLAC TROMETHAMINE 30 MG/ML IJ SOLN
INTRAMUSCULAR | Status: AC
  Filled 2021-11-13: qty 1

## 2021-11-13 MED ORDER — SODIUM CHLORIDE 0.9 % IV SOLN: INTRAVENOUS | Status: DC

## 2021-11-13 MED ORDER — TRIAMCINOLONE ACETONIDE 40 MG/ML IJ SUSP
INTRAMUSCULAR | Status: AC
  Filled 2021-11-13: qty 2

## 2021-11-13 MED ORDER — MIDAZOLAM HCL 5 MG/5ML IJ SOLN
INTRAMUSCULAR | Status: DC | PRN
  Administered 2021-11-13: 2 mg via INTRAVENOUS

## 2021-11-13 MED ORDER — ACETAMINOPHEN 10 MG/ML IV SOLN
INTRAVENOUS | Status: DC | PRN
  Administered 2021-11-13: 1000 mg via INTRAVENOUS

## 2021-11-13 MED ORDER — TRANEXAMIC ACID 1000 MG/10ML IV SOLN
INTRAVENOUS | Status: AC
  Filled 2021-11-13: qty 10

## 2021-11-13 MED ORDER — KETOROLAC TROMETHAMINE 15 MG/ML IJ SOLN
INTRAMUSCULAR | Status: AC
  Administered 2021-11-13: 15 mg via INTRAVENOUS
  Filled 2021-11-13: qty 1

## 2021-11-13 MED ORDER — BUPIVACAINE-EPINEPHRINE (PF) 0.5% -1:200000 IJ SOLN
INTRAMUSCULAR | Status: AC
  Filled 2021-11-13: qty 30

## 2021-11-13 SURGICAL SUPPLY — 67 items
APL PRP STRL LF DISP 70% ISPRP (MISCELLANEOUS) ×2
BIT DRILL QUICK REL 1/8 2PK SL (DRILL) IMPLANT
BLADE SAW SAG 25X90X1.19 (BLADE) ×2 IMPLANT
BLADE SURG SZ20 CARB STEEL (BLADE) ×2 IMPLANT
BNDG CMPR STD VLCR NS LF 5.8X6 (GAUZE/BANDAGES/DRESSINGS) ×1
BNDG ELASTIC 6X5.8 VLCR NS LF (GAUZE/BANDAGES/DRESSINGS) ×2 IMPLANT
BRNG TIB 83X14 ANT STAB MDLR (Insert) ×1 IMPLANT
CEMENT BONE R 1X40 (Cement) ×4 IMPLANT
CEMENT VACUUM MIXING SYSTEM (MISCELLANEOUS) ×2 IMPLANT
CHLORAPREP W/TINT 26 (MISCELLANEOUS) ×3 IMPLANT
COOLER POLAR GLACIER W/PUMP (MISCELLANEOUS) ×2 IMPLANT
COVER MAYO STAND REUSABLE (DRAPES) ×2 IMPLANT
CUFF TOURN SGL QUICK 24 (TOURNIQUET CUFF)
CUFF TOURN SGL QUICK 34 (TOURNIQUET CUFF)
CUFF TRNQT CYL 24X4X16.5-23 (TOURNIQUET CUFF) IMPLANT
CUFF TRNQT CYL 34X4.125X (TOURNIQUET CUFF) IMPLANT
DRAPE 3/4 80X56 (DRAPES) ×2 IMPLANT
DRAPE IMP U-DRAPE 54X76 (DRAPES) ×2 IMPLANT
DRAPE U-SHAPE 47X51 STRL (DRAPES) ×2 IMPLANT
DRILL QUICK RELEASE 1/8 INCH (DRILL) ×3
DRSG MEPILEX SACRM 8.7X9.8 (GAUZE/BANDAGES/DRESSINGS) IMPLANT
DRSG OPSITE POSTOP 4X10 (GAUZE/BANDAGES/DRESSINGS) ×2 IMPLANT
DRSG OPSITE POSTOP 4X8 (GAUZE/BANDAGES/DRESSINGS) ×1 IMPLANT
ELECT REM PT RETURN 9FT ADLT (ELECTROSURGICAL) ×2
ELECTRODE REM PT RTRN 9FT ADLT (ELECTROSURGICAL) ×1 IMPLANT
FEMORAL COMPONENT 70MM RIGHT (Joint) ×1 IMPLANT
GAUZE XEROFORM 1X8 LF (GAUZE/BANDAGES/DRESSINGS) ×2 IMPLANT
GLOVE BIO SURGEON STRL SZ7.5 (GLOVE) ×8 IMPLANT
GLOVE BIO SURGEON STRL SZ8 (GLOVE) ×8 IMPLANT
GLOVE BIOGEL PI IND STRL 8 (GLOVE) ×1 IMPLANT
GLOVE BIOGEL PI INDICATOR 8 (GLOVE) ×1
GLOVE SURG UNDER LTX SZ8 (GLOVE) ×2 IMPLANT
GOWN STRL REUS W/ TWL LRG LVL3 (GOWN DISPOSABLE) ×1 IMPLANT
GOWN STRL REUS W/ TWL XL LVL3 (GOWN DISPOSABLE) ×1 IMPLANT
GOWN STRL REUS W/TWL LRG LVL3 (GOWN DISPOSABLE) ×2
GOWN STRL REUS W/TWL XL LVL3 (GOWN DISPOSABLE) ×2
HOOD PEEL AWAY FLYTE STAYCOOL (MISCELLANEOUS) ×7 IMPLANT
INSERT TIB BEARING 86X14 (Insert) ×1 IMPLANT
IV NS IRRIG 3000ML ARTHROMATIC (IV SOLUTION) ×2 IMPLANT
KIT TURNOVER KIT A (KITS) ×2 IMPLANT
MANIFOLD NEPTUNE II (INSTRUMENTS) ×2 IMPLANT
NDL SPNL 20GX3.5 QUINCKE YW (NEEDLE) ×1 IMPLANT
NEEDLE SPNL 20GX3.5 QUINCKE YW (NEEDLE) ×2 IMPLANT
NS IRRIG 1000ML POUR BTL (IV SOLUTION) ×2 IMPLANT
PACK TOTAL KNEE (MISCELLANEOUS) ×2 IMPLANT
PAD WRAPON POLAR KNEE (MISCELLANEOUS) ×1 IMPLANT
PAD WRAPON POLOR MULTI XL (MISCELLANEOUS) IMPLANT
PEG PATELLA SERIES A 37MMX10MM (Orthopedic Implant) ×1 IMPLANT
PENCIL SMOKE EVACUATOR (MISCELLANEOUS) ×2 IMPLANT
PLATE TIBIAL INTERLOK 83 (Plate) ×1 IMPLANT
PULSAVAC PLUS IRRIG FAN TIP (DISPOSABLE) ×2
SET GUIDE SURG MEDIAL KNEE (INSTRUMENTS) ×1 IMPLANT
STAPLER SKIN PROX 35W (STAPLE) ×2 IMPLANT
SUCTION FRAZIER HANDLE 10FR (MISCELLANEOUS) ×1
SUCTION TUBE FRAZIER 10FR DISP (MISCELLANEOUS) ×1 IMPLANT
SUT VIC AB 0 CT1 36 (SUTURE) ×6 IMPLANT
SUT VIC AB 2-0 CT1 27 (SUTURE) ×6
SUT VIC AB 2-0 CT1 TAPERPNT 27 (SUTURE) ×3 IMPLANT
SYR 10ML LL (SYRINGE) ×2 IMPLANT
SYR 20ML LL LF (SYRINGE) ×2 IMPLANT
SYR 30ML LL (SYRINGE) IMPLANT
TIP FAN IRRIG PULSAVAC PLUS (DISPOSABLE) ×1 IMPLANT
TRAP FLUID SMOKE EVACUATOR (MISCELLANEOUS) ×2 IMPLANT
WATER STERILE IRR 500ML POUR (IV SOLUTION) ×2 IMPLANT
WRAP-ON POLOR PAD MULTI XL (MISCELLANEOUS) ×1
WRAPON POLAR PAD KNEE (MISCELLANEOUS)
WRAPON POLOR PAD MULTI XL (MISCELLANEOUS) ×2

## 2021-11-13 NOTE — Anesthesia Preprocedure Evaluation (Signed)
Anesthesia Evaluation  Patient identified by MRN, date of birth, ID band Patient awake    Reviewed: Allergy & Precautions, NPO status , Patient's Chart, lab work & pertinent test results  History of Anesthesia Complications Negative for: history of anesthetic complications  Airway Mallampati: II  TM Distance: >3 FB Neck ROM: Full    Dental no notable dental hx. (+) Teeth Intact   Pulmonary asthma , neg sleep apnea, neg COPD, Current Smoker and Patient abstained from smoking.,    Pulmonary exam normal breath sounds clear to auscultation       Cardiovascular Exercise Tolerance: Good METShypertension, Pt. on medications (-) CAD and (-) Past MI (-) dysrhythmias  Rhythm:Regular Rate:Normal - Systolic murmurs    Neuro/Psych PSYCHIATRIC DISORDERS Anxiety negative neurological ROS  negative psych ROS   GI/Hepatic GERD  Medicated and Controlled,(+)     (-) substance abuse  ,   Endo/Other  neg diabetesMorbid obesity  Renal/GU negative Renal ROS     Musculoskeletal  (+) Arthritis ,   Abdominal (+) + obese,   Peds  Hematology   Anesthesia Other Findings Past Medical History: No date: Anxiety No date: Arthritis No date: Asthma No date: Diverticulitis No date: Hypertension  Reproductive/Obstetrics                             Anesthesia Physical Anesthesia Plan  ASA: 3  Anesthesia Plan: Spinal   Post-op Pain Management: Ofirmev IV (intra-op)*   Induction: Intravenous  PONV Risk Score and Plan: 0 and Ondansetron, Dexamethasone, Propofol infusion, TIVA and Midazolam  Airway Management Planned: Natural Airway  Additional Equipment: None  Intra-op Plan:   Post-operative Plan:   Informed Consent: I have reviewed the patients History and Physical, chart, labs and discussed the procedure including the risks, benefits and alternatives for the proposed anesthesia with the patient or  authorized representative who has indicated his/her understanding and acceptance.       Plan Discussed with: CRNA and Surgeon  Anesthesia Plan Comments: (Discussed R/B/A of neuraxial anesthesia technique with patient: - rare risks of spinal/epidural hematoma, nerve damage, infection - Risk of PDPH - Risk of nausea and vomiting - Risk of conversion to general anesthesia and its associated risks, including sore throat, damage to lips/eyes/teeth/oropharynx, and rare risks such as cardiac and respiratory events. - Risk of allergic reactions  Patient informed about increased incidence of above perioperative risk due to high BMI. Patient understands.]  Discussed the role of CRNA in patient's perioperative care.  Patient voiced understanding.)        Anesthesia Quick Evaluation

## 2021-11-13 NOTE — H&P (Signed)
History of Present Illness: Anthony [name] is a 41 y.o. male who presents today for his surgical history and physical for upcoming right total knee arthroplasty. The patient is scheduled for a right total knee arthroplasty with Dr. Joice Lofts on 11/13/2021. The patient denies any changes in his medical history since he was last evaluated. He denies any personal history of heart attack or stroke. The patient does have a history of asthma when he was a child, he did contract COVID last year which did require the use of inhaler initially. However, he states that it has been several months since he has used any rescue inhaler. He denies any personal history of DVT. The patient denies any history of COPD. The patient continues report an aching and throbbing pain in the right knee. Patient denies any numbness or tingling to the right lower extremity pain will radiate into the thigh and occasionally into the foot on occasion. He is not using any ice or heat. He describes the pain as a constant discomfort. Pain score today is a 6 out of 10.  Past Medical History: Arthritis  Asthma without status asthmaticus  Diverticulitis 10/12/2015  Hypertension   Past Surgical History: Past Surgical History:  Procedure Laterality Date  right shoulder Right 08/1999   Past Family History: Arthritis Mother  Stroke Mother  Arthritis Father  Stroke Father   Medications: amLODIPine (NORVASC) 5 MG tablet Take 5 mg by mouth once daily for Blood Pressure  hydrOXYzine (ATARAX) 25 MG tablet Take 25 mg by mouth once as needed  lisinopriL (ZESTRIL) 40 MG tablet TAKE 1 AND 1/2 TABLETS BY MOUTH DAILY FOR HIGH BLOOD PRESSURE  pantoprazole (PROTONIX) 40 MG DR tablet Take 40 mg by mouth once daily   Allergies: Hydrocodone-Acetaminophen Hives  Hydrochlorothiazide Other (See Comments)  Oxycodone Hives    Review of Systems:  A comprehensive 14 point ROS was performed, reviewed by me today, and the pertinent orthopaedic findings  are documented in the HPI.  Physical Exam: BP 136/86 (BP Location: Left upper arm, Patient Position: Sitting, BP Cuff Size: Adult)  Ht 180.3 cm (5\' 11" )  Wt (!) 153.6 kg (338 lb 9.6 oz)  BMI 47.23 kg/m  General/Constitutional: The patient appears to be well-nourished, well-developed, and in no acute distress. Neuro/Psych: Normal mood and affect, oriented to person, place and time. Eyes: Non-icteric. Pupils are equal, round, and reactive to light, and exhibit synchronous movement. ENT: Unremarkable. Lymphatic: No palpable adenopathy. Respiratory: Lungs clear to auscultation, Normal chest excursion, No wheezes, and Non-labored breathing Cardiovascular: Regular rate and rhythm. No murmurs. and No edema, swelling or tenderness, except as noted in detailed exam. Integumentary: No impressive skin lesions present, except as noted in detailed exam. Musculoskeletal: Unremarkable, except as noted in detailed exam.  Right knee exam: GAIT: antalgic and uses no assistive devices. ALIGNMENT: moderate varus SKIN: unremarkable SWELLING: mild EFFUSION: small WARMTH: no warmth TENDERNESS: mild over the medial joint line ROM: 5 to 85 degrees with pain in maximal flexion McMURRAY'S: equivocal PATELLOFEMORAL: normal tracking with no peri-patellar tenderness and negative apprehension sign CREPITUS: no LACHMAN'S: negative PIVOT SHIFT: negative ANTERIOR DRAWER: negative POSTERIOR DRAWER: negative VARUS/VALGUS: mildly positive pseudolaxity to varus stressing  He is neurovascularly intact to the right lower extremity and foot.  Imaging: CT OF THE RIGHT KNEE:  1. Severe osteoarthritis of the right knee as described above.   Impression: 1. Primary osteoarthritis of right knee. 2. Synovial cyst of right popliteal space. 3. Loose body of right knee.  Plan:  1. Treatment options were discussed today with the patient. 2. The patient is scheduled for a right total knee arthroplasty with Dr. Joice Lofts on  11/13/2021. 3. The patient was instructed in detail on the risk and benefits of surgery and wishes to proceed at this time. 4. This document will serve as the surgical history and physical for the patient. 5. The patient will follow-up per standard postop protocol. They can call the clinic they have any questions, new symptoms develop or symptoms worsen.  The procedure was discussed with the patient, as were the potential risks (including bleeding, infection, nerve and/or blood vessel injury, persistent or recurrent pain, failure of the hardware, knee instability, need for further surgery, blood clots, strokes, heart attacks and/or arhythmias, pneumonia, etc.) and benefits. The patient states his understanding and wishes to proceed.   H&P reviewed and patient re-examined. No changes.

## 2021-11-13 NOTE — Progress Notes (Signed)
Spoke to Dr. Suzan Slick about patients pain going up to an 8/10 in the right knee and that it wasn't time for the oral medication til 3pm.  He gave me a verbal order for 0.5 mg Dilaudid every 5 mins up to 2 mg.  I acknowledged and placed the order.

## 2021-11-13 NOTE — Op Note (Signed)
11/13/2021  10:37 AM  Patient:   Anthony [name]  Pre-Op Diagnosis:   Degenerative joint disease, right knee.  Post-Op Diagnosis:   Same  Procedure:   Right TKA using Signature-guided all-cemented [employer] Vanguard system with a 70 mm PCR femur, an  83  mm tibial tray with a 14 mm anterior stabilized E-poly insert, and a 37 x 10 mm all-poly 3-pegged domed patella.  Surgeon:   Maryagnes Amos, MD  Assistant:   Horris Latino, PA-C; Justice Deeds, PA-S  Anesthesia:   Spinal  Findings:   As above  Complications:   None  EBL:   20 cc  Fluids:   1100 cc crystalloid  UOP:   None cc  TT:   105 minutes at 300 mmHg  Drains:   None  Closure:   Staples  Implants:   As above  Brief Clinical Note:   The patient is a 41 year old male with a long history of progressively worsening right knee pain. The patient's symptoms have progressed despite medications, activity modification, injections, etc. The patient's history and examination were consistent with advanced degenerative joint disease of the right knee confirmed by plain radiographs. The patient presents at this time for a right total knee arthroplasty.  Procedure:   The patient was brought into the operating room. After adequate spinal anesthesia was obtained, the patient was lain in the supine position before the right lower extremity was prepped with ChloraPrep solution and draped sterilely. Preoperative antibiotics were administered. A timeout was performed to verify the appropriate surgical site before the limb was exsanguinated with an Esmarch and the tourniquet inflated to 300 mmHg.   A standard anterior approach to the knee was made through an approximately 7 inch incision. The incision was carried down through the subcutaneous tissues to expose superficial retinaculum. This was split the length of the incision and the medial flap elevated sufficiently to expose the medial retinaculum. The medial retinaculum was incised, leaving a  3-4 mm cuff of tissue on the patella. This was extended distally along the medial border of the patellar tendon and proximally through the medial third of the quadriceps tendon. A subtotal fat pad excision was performed before the soft tissues were elevated off the anteromedial and anterolateral aspects of the proximal tibia to the level of the collateral ligaments. The anterior portions of the medial and lateral menisci were removed, as was the anterior cruciate ligament. With the knee flexed to 90, the signature femoral guide was positioned over the distal femur and the anterior and distal pins placed. The distal pins were removed before the distal femoral cutting block was placed over the anterior pins and the appropriate distal cut made.  Utilizing the previously placed pin holes distally, the 70 mm 4-in-1 cutting block was positioned and temporarily secured.  First the anterior, then the anterior chamfer, the posterior, and finally the posterior chamfer cuts were made. These pieces were removed before the posterior portions of the medial and lateral menisci were removed as well.  Attention was directed to the proximal tibia. Again, the signature tibial guide was positioned appropriately and the anterior pins placed. The appropriate tibial cutting block was positioned and the appropriate proximal tibial cut made. The surface was measured and found to be optimally covered with an 83 mm tibial component.   A trial reduction was performed using the appropriate femoral and tibial components with first the 10 mm, then the 12 mm, and finally the 14 mm insert. The 14 mm  insert demonstrated excellent stability to varus and valgus stressing both in flexion and extension while permitting full extension. Patella tracking was assessed and found to be excellent. Therefore, the tibial guide position was marked on the proximal tibia. The patella thickness was measured and found to be 24 mm. The appropriate cut was made.  The patellar surface was measured and found to be optimally replicated by the 37 mm component. The three peg holes were drilled in place before the trial button was inserted. Patella tracking was assessed and found to be excellent, passing the "no thumb test". The lug holes were drilled into the distal femur before the trial component was removed, leaving only the tibial tray. The keel was then created using the appropriate tower, reamer, and punch.  The bony surfaces were prepared for cementing by irrigating them thoroughly with sterile saline solution via the jet lavage system. A bone plug was fashioned from some of the bone that had been removed previously and used to plug the distal femoral canal. In addition, 20 cc of Exparel, 30 mg of Toradol, and 80 mg of Kenalog 40 were mixed with 30 cc of 0.5% Sensorcaine and another 40 cc of sterile saline.  This concoction injected into the postero-medial and postero-lateral aspects of the knee, the medial and lateral gutter regions, and the peri-incisional tissues to help with postoperative analgesia.   Meanwhile, the cement was being mixed on the back table. When it was ready, the tibial tray was cemented in first. The excess cement was removed using Personal assistant. Next, the femoral component was impacted into place. Again, the excess cement was removed using Personal assistant. The 14 mm trial insert was positioned and the knee brought into extension while the cement hardened. Finally, the patella was cemented into place and secured using the patellar clamp. Again, the excess cement was removed using Personal assistant. Once the cement had hardened, the knee was placed through a range of motion with the findings as described above. Therefore, the trial insert was removed and, after verifying that no cement had been retained posteriorly, the permanent 14 mm anterior stabilized E-polyethylene insert was positioned and secured using the appropriate key locking mechanism.  Again the knee was placed through a range of motion with the findings as described above.  The wound was copiously irrigated with sterile saline solution using the jet lavage system before the quadriceps tendon and retinacular layer were reapproximated using #0 Vicryl interrupted sutures. The superficial retinacular layer also was closed using a running #0 Vicryl suture. A total of 10 cc of transexemic acid (TXA) was injected intra-articularly before the subcutaneous tissues were closed in several layers using 2-0 Vicryl interrupted sutures. The skin was closed using staples. A sterile honeycomb dressing was applied to the skin before the leg was wrapped with an Ace wrap to accommodate the Polar Care device. The patient was then awakened and returned to the recovery room in satisfactory condition after tolerating the procedure well.

## 2021-11-13 NOTE — Anesthesia Procedure Notes (Signed)
Spinal  Patient location during procedure: OR Start time: 11/13/2021 7:45 AM End time: 11/13/2021 7:52 AM Reason for block: surgical anesthesia Staffing Performed: resident/CRNA  Resident/CRNA: Demetrius Charity, CRNA Performed by: Demetrius Charity, CRNA Authorized by: Arita Miss, MD   Preanesthetic Checklist Completed: patient identified, IV checked, site marked, risks and benefits discussed, surgical consent, monitors and equipment checked, pre-op evaluation and timeout performed Spinal Block Patient position: sitting Prep: ChloraPrep Patient monitoring: heart rate, continuous pulse ox, blood pressure and cardiac monitor Approach: midline Location: L4-5 Injection technique: single-shot Needle Needle type: Whitacre and Introducer  Needle gauge: 24 G Needle length: 9 cm Assessment Sensory level: T10 Events: CSF return Additional Notes Negative paresthesia. Negative blood return. Positive free-flowing CSF. Expiration date of kit checked and confirmed. Patient tolerated procedure well, without complications.

## 2021-11-13 NOTE — Discharge Instructions (Addendum)
Orthopedic discharge instructions: May shower with intact OpSite dressing. Apply ice frequently to knee or use Polar Care. Start Eliquis 5 mg twice daily for 2 weeks on Wednesday, 11/14/2021, then take aspirin 325 mg twice daily for 4 weeks. Take pain medication as prescribed when needed.  May supplement with ES Tylenol if necessary. May weight-bear as tolerated on right leg - use walker for balance and support. Follow-up in 10-14 days or as scheduled.    AMBULATORY SURGERY  DISCHARGE INSTRUCTIONS   The drugs that you were given will stay in your system until tomorrow so for the next 24 hours you should not:  Drive an automobile Make any legal decisions Drink any alcoholic beverage   You may resume regular meals tomorrow.  Today it is better to start with liquids and gradually work up to solid foods.  You may eat anything you prefer, but it is better to start with liquids, then soup and crackers, and gradually work up to solid foods.   Please notify your doctor immediately if you have any unusual bleeding, trouble breathing, redness and pain at the surgery site, drainage, fever, or pain not relieved by medication.    Additional Instructions:     Please contact your physician with any problems or Same Day Surgery at [phone], Monday through Friday 6 am to 4 pm, or [place] at [facility] number at [phone].   POLAR CARE INFORMATION  MassAdvertisement.it  How to use [employer] Polar Care [facility] Therapy System?  YouTube   ShippingScam.co.uk  OPERATING INSTRUCTIONS  Start the product With dry hands, connect the transformer to the electrical connection located on the top of the cooler. Next, plug the transformer into an appropriate electrical outlet. The unit will automatically start running at this point.  To stop the pump, disconnect electrical power.  Unplug to stop the product when not in use. Unplugging the Polar Care unit turns it off.  Always unplug immediately after use. Never leave it plugged in while unattended. Remove pad.    FIRST ADD WATER TO FILL LINE, THEN ICE---Replace ice when existing ice is almost melted  1 Discuss Treatment with your Licensed Health Care Practitioner and Use Only as Prescribed 2 Apply Insulation Barrier & Cold Therapy Pad 3 Check for Moisture 4 Inspect Skin Regularly  Tips and Trouble Shooting Usage Tips 1. Use cubed or chunked ice for optimal performance. 2. It is recommended to drain the Pad between uses. To drain the pad, hold the Pad upright with the hose pointed toward the ground. Depress the black plunger and allow water to drain out. 3. You may disconnect the Pad from the unit without removing the pad from the affected area by depressing the silver tabs on the hose coupling and gently pulling the hoses apart. The Pad and unit will seal itself and will not leak. Note: Some dripping during release is normal. 4. DO NOT RUN PUMP WITHOUT WATER! The pump in this unit is designed to run with water. Running the unit without water will cause permanent damage to the pump. 5. Unplug unit before removing lid.  TROUBLESHOOTING GUIDE Pump not running, Water not flowing to the pad, Pad is not getting cold 1. Make sure the transformer is plugged into the wall outlet. 2. Confirm that the ice and water are filled to the indicated levels. 3. Make sure there are no kinks in the pad. 4. Gently pull on the blue tube to make sure the tube/pad junction is straight. 5. Remove the  pad from the treatment site and ll it while the pad is lying at; then reapply. 6. Confirm that the pad couplings are securely attached to the unit. Listen for the double clicks (Figure 1) to confirm the pad couplings are securely attached.  Leaks    Note: Some condensation on the lines, controller, and pads is unavoidable, especially in warmer climates. 1. If using a [employer] Polar Care Cold Therapy unit with a detachable Cold Therapy  Pad, and a leak exists (other than condensation on the lines) disconnect the pad couplings. Make sure the silver tabs on the couplings are depressed before reconnecting the pad to the pump hose; then confirm both sides of the coupling are properly clicked in. 2. If the coupling continues to leak or a leak is detected in the pad itself, stop using it and call [employer] Customer Care at [phone].  Cleaning After use, empty and dry the unit with a soft cloth. Warm water and mild detergent may be used occasionally to clean the pump and tubes.  WARNING: The Polar Care Cube can be cold enough to cause serious injury, including full skin necrosis. Follow these Operating Instructions, and carefully read the Product Insert (see pouch on side of unit) and the Cold Therapy Pad Fitting Instructions (provided with each Cold Therapy Pad) prior to use.      Please go to the following website to access important education materials concerning your upcoming joint replacement.                                   [email or web address]

## 2021-11-13 NOTE — Transfer of Care (Signed)
Immediate Anesthesia Transfer of Care Note  Patient: VALERIE FREDIN II  Procedure(s) Performed: TOTAL KNEE ARTHROPLASTY (Right: Knee)  Patient Location: PACU  Anesthesia Type:General  Level of Consciousness: awake, alert  and oriented  Airway & Oxygen Therapy: Patient Spontanous Breathing and Patient connected to face mask oxygen  Post-op Assessment: Report given to RN and Post -op Vital signs reviewed and stable  Post vital signs: Reviewed and stable  Last Vitals:  Vitals Value Taken Time  BP 126/87 11/13/21 1031  Temp    Pulse 95 11/13/21 1037  Resp 11 11/13/21 1037  SpO2 96 % 11/13/21 1037  Vitals shown include unvalidated device data.  Last Pain:  Vitals:   11/13/21 0639  TempSrc: Temporal  PainSc: 5          Complications: No notable events documented.

## 2021-11-13 NOTE — TOC Progression Note (Signed)
Transition of Care [facility]) - Progression Note    Patient Details  Name: Anthony [name] MRN: [id number] Date of Birth: [birth year]  [facility] CM/SW Contact  Marlowe Sax, RN Phone Number: 11/13/2021, 10:55 AM  Clinical Narrative:     Patient will have outpatient PT, Will get a bariatric 3 in 1 and RW delivered by adapt to the room       Expected Discharge Plan and Services                                                 Social Determinants of Health (SDOH) Interventions    Readmission Risk Interventions     No data to display

## 2021-11-13 NOTE — Progress Notes (Signed)
Physical Therapy Evaluation Patient Details Name: Anthony [name] MRN: [id number] DOB: [birth year] Today's Date: 11/13/2021  History of Present Illness  Pt is a 41 y.o. male with PMH including arthritis, asthma, HTN, and diverticulitis in 2017. Pt underwent R TKA on 11/13/21.   Clinical Impression  Pt is a pleasant 41 year old male who was admitted for R TKA. Pt donned clothing while supine with min A to get clothing over bilat feet prior to patient pulling LE clothing up independently. Pt independent with UE dressing. Pt completed all therapeutic exercises in HEP booklet with assist required to complete SLRs, and limited ROM with heel slides, SAQs, LAQs, knee flexion. Pt R knee ROM 4-75 at time of evaluation. Pt performs bed mobility mod I, transfers and ambulation with RW and CGA. Pt ambulated 200 feet with RW and CGA with a stop in the bathroom to void and was independent with hygiene and hand washing. Pt also completed 4 steps with bilat rails with CGA. Pt demonstrates deficits with R LE strength, ROM, and balance. Pt education provided on car transfers, polar care, compression stockings, home safety with use of RW. Would benefit from skilled PT to address above deficits and promote optimal return to PLOF. Recommend outpatient PT at discharge due to pt current mobility status and family support at home. This entire session was guided, instructed, and directly supervised by Elizabeth Palau, PT, DPT, GCS.   Recommendations for follow up therapy are one component of a multi-disciplinary discharge planning process, led by the attending physician.  Recommendations may be updated based on patient status, additional functional criteria and insurance authorization.  Follow Up Recommendations Outpatient PT      Assistance Recommended at Discharge PRN  Patient can return home with the following  A little help with walking and/or transfers;A little help with bathing/dressing/bathroom;Assistance with  cooking/housework;Assist for transportation;Help with stairs or ramp for entrance    Equipment Recommendations Rolling walker (2 wheels);BSC/3in1  Recommendations for Other Services       Functional Status Assessment Patient has had a recent decline in their functional status and demonstrates the ability to make significant improvements in function in a reasonable and predictable amount of time.     Precautions / Restrictions Precautions Precautions: Knee;Fall Precaution Booklet Issued: Yes (comment) Restrictions Weight Bearing Restrictions: Yes RLE Weight Bearing: Weight bearing as tolerated      Mobility  Bed Mobility Overal bed mobility: Modified Independent             General bed mobility comments: increased time/effort to complete due to R knee pain    Transfers Overall transfer level: Needs assistance Equipment used: Rolling walker (2 wheels) Transfers: Sit to/from Stand Sit to Stand: Min guard           General transfer comment: pt required 2 attempts to stand. able to reposition himself and use momentum to stand up with WB primarily through L LE    Ambulation/Gait Ambulation/Gait assistance: Min guard Gait Distance (Feet): 200 Feet Assistive device: Rolling walker (2 wheels) (bariatric RW) Gait Pattern/deviations: Step-through pattern, Antalgic Gait velocity: slightly decreased     General Gait Details: Antalgic, step-through continuous gait pattern.  Stairs Stairs: Yes Stairs assistance: Min guard Stair Management: Two rails, Forwards Number of Stairs: 4 General stair comments: After verbal instruction and demonstration for proper technique, pt completed 4 steps with bilat rail to simulate entering/exiting home from garage. CGA. Pt demonstrated stairs safely.  Wheelchair Mobility    Modified Rankin (Stroke Patients  Only)       Balance Overall balance assessment: Needs assistance Sitting-balance support: No upper extremity supported, Feet  supported Sitting balance-Leahy Scale: Normal Sitting balance - Comments: static sitting EOB   Standing balance support: Reliant on assistive device for balance, Bilateral upper extremity supported, During functional activity Standing balance-Leahy Scale: Good Standing balance comment: Pt ambulates utilizing bilat UE support. Static standing with 1 UE supported without LOB                             Pertinent Vitals/Pain Pain Assessment Pain Assessment: 0-10 Pain Score: 7  (Pt reported 7/10 pain at start of session and 9/10 pain at end.) Pain Location: R knee Pain Descriptors / Indicators: Discomfort, Dull, Operative site guarding Pain Intervention(s): Limited activity within patient's tolerance, Monitored during session, Premedicated before session, Repositioned, Ice applied    Home Living Family/patient expects to be discharged to:: Private residence Living Arrangements: Spouse/significant other Available Help at Discharge: Family;Available PRN/intermittently Type of Home: House Home Access: Stairs to enter Entrance Stairs-Rails: Can reach both Entrance Stairs-Number of Steps: 4 (in garage)   Home Layout: One level        Prior Function Prior Level of Function : Independent/Modified Independent;Working/employed;Driving             Mobility Comments: Pt ambulatory without AD. Working as Naval architect. Driving. ADLs Comments: Independent with all ADLs     Hand Dominance   Dominant Hand: Left    Extremity/Trunk Assessment   Upper Extremity Assessment Upper Extremity Assessment: Overall WFL for tasks assessed    Lower Extremity Assessment Lower Extremity Assessment: RLE deficits/detail RLE Deficits / Details: POD 0 from R TKA RLE: Unable to fully assess due to pain RLE Sensation: WNL    Cervical / Trunk Assessment Cervical / Trunk Assessment: Normal  Communication   Communication: No difficulties  Cognition Arousal/Alertness:  Awake/alert Behavior During Therapy: WFL for tasks assessed/performed Overall Cognitive Status: Within Functional Limits for tasks assessed                                 General Comments: A&Ox4        General Comments      Exercises Total Joint Exercises Ankle Circles/Pumps: AROM, Both, 10 reps, Supine Quad Sets: AROM, Right, 10 reps, Supine Short Arc Quad: AROM, Strengthening, Right, 5 reps, Supine Heel Slides: AROM, Strengthening, Right, 5 reps, Supine Hip ABduction/ADduction: AROM, Strengthening, Right, 5 reps, Supine, Other (comment) (PT hand hold under heel) Straight Leg Raises: AAROM, Right, 5 reps, Supine Long Arc Quad: AROM, Strengthening, Right, 10 reps, Seated Knee Flexion: AROM, Strengthening, Right, 15 reps, Seated Goniometric ROM: R knee ROM 4-75 Other Exercises Other Exercises: Pt education provided on car transfers, polar care, compression stockings, home safety with use of RW. Pt donned clothing while supine with min A to get clothing over bilat feet prior to patient pulling LE clothing up independently. Pt independent with UE dressing. Pt also ambulated to bathroom to void and was independent with hygiene and hand washing.   Assessment/Plan    PT Assessment Patient needs continued PT services  PT Problem List Decreased strength;Decreased range of motion;Decreased activity tolerance;Decreased balance;Decreased mobility;Decreased knowledge of use of DME;Pain       PT Treatment Interventions DME instruction;Gait training;Stair training;Functional mobility training;Therapeutic activities;Therapeutic exercise;Balance training;Patient/family education    PT Goals (Current goals can be found  in the Care Plan section)  Acute Rehab PT Goals Patient Stated Goal: to go home PT Goal Formulation: With patient Time For Goal Achievement: 11/27/21 Potential to Achieve Goals: Good    Frequency BID     Co-evaluation               AM-PAC PT "6  Clicks" Mobility  Outcome Measure Help needed turning from your back to your side while in a flat bed without using bedrails?: None Help needed moving from lying on your back to sitting on the side of a flat bed without using bedrails?: None Help needed moving to and from a bed to a chair (including a wheelchair)?: A Little Help needed standing up from a chair using your arms (e.g., wheelchair or bedside chair)?: A Little Help needed to walk in hospital room?: A Little Help needed climbing 3-5 steps with a railing? : A Little 6 Click Score: 20    End of Session Equipment Utilized During Treatment: Gait belt Activity Tolerance: Patient tolerated treatment well Patient left: in bed;with call bell/phone within reach;Other (comment) (with polar care in place) Nurse Communication: Mobility status PT Visit Diagnosis: Other abnormalities of gait and mobility (R26.89);Muscle weakness (generalized) (M62.81);Pain Pain - Right/Left: Right Pain - part of body: Knee    Time: 1440-1535 PT Time Calculation (min) (ACUTE ONLY): 55 min   Charges:              Radford Pax, SPT  Dwon Sky 11/13/2021, 3:55 PM

## 2021-11-13 NOTE — Progress Notes (Cosign Needed)
Patient is not able to walk the distance required to go the bathroom, or he/she is unable to safely negotiate stairs required to access the bathroom.  A 3in1 BSC will alleviate this problem  

## 2021-11-14 ENCOUNTER — Encounter: Payer: Self-pay | Admitting: Surgery

## 2021-11-15 NOTE — Anesthesia Postprocedure Evaluation (Signed)
Anesthesia Post Note  Patient: Anthony [name]  Procedure(s) Performed: TOTAL KNEE ARTHROPLASTY (Right: Knee)  Patient location during evaluation: PACU Anesthesia Type: Spinal Level of consciousness: oriented and awake and alert Pain management: pain level controlled Vital Signs Assessment: post-procedure vital signs reviewed and stable Respiratory status: spontaneous breathing, respiratory function stable and patient connected to nasal cannula oxygen Cardiovascular status: blood pressure returned to baseline and stable Postop Assessment: no headache, no backache and no apparent nausea or vomiting Anesthetic complications: no   No notable events documented.   Last Vitals:  Vitals:   11/13/21 1253 11/13/21 1421  BP: 104/80 119/89  Pulse: (!) 56 60  Resp: 16 16  Temp: (!) 35.8 C (!) 36.1 C  SpO2: 94% 95%    Last Pain:  Vitals:   11/14/21 0842  TempSrc:   PainSc: 4                  Corinda Gubler

## 2021-11-20 ENCOUNTER — Other Ambulatory Visit: Payer: Self-pay | Admitting: Student

## 2021-11-20 ENCOUNTER — Ambulatory Visit
Admission: RE | Admit: 2021-11-20 | Discharge: 2021-11-20 | Disposition: A | Discharge status: Home / Self Care | Payer: Medicaid Other | Source: Ambulatory Visit | Attending: Student | Admitting: Student

## 2021-11-20 DIAGNOSIS — Z96651 Presence of right artificial knee joint: Secondary | ICD-10-CM

## 2021-11-20 DIAGNOSIS — R2240 Localized swelling, mass and lump, unspecified lower limb: Secondary | ICD-10-CM

## 2022-08-09 ENCOUNTER — Encounter: Payer: Self-pay | Admitting: Radiology

## 2022-08-09 ENCOUNTER — Emergency Department: Payer: Medicaid Other

## 2022-08-09 ENCOUNTER — Emergency Department
Admission: EM | Admit: 2022-08-09 | Discharge: 2022-08-09 | Disposition: A | Discharge status: Home / Self Care | Payer: Medicaid Other | Attending: Emergency Medicine | Admitting: Emergency Medicine

## 2022-08-09 DIAGNOSIS — M7989 Other specified soft tissue disorders: Secondary | ICD-10-CM | POA: Diagnosis not present

## 2022-08-09 DIAGNOSIS — R21 Rash and other nonspecific skin eruption: Secondary | ICD-10-CM

## 2022-08-09 DIAGNOSIS — R2243 Localized swelling, mass and lump, lower limb, bilateral: Secondary | ICD-10-CM | POA: Diagnosis present

## 2022-08-09 LAB — CBC
HCT: 43.8 % (ref 39.0–52.0)
Hemoglobin: 15 g/dL (ref 13.0–17.0)
MCH: 31 pg (ref 26.0–34.0)
MCHC: 34.2 g/dL (ref 30.0–36.0)
MCV: 90.5 fL (ref 80.0–100.0)
Platelets: 176 10*3/uL (ref 150–400)
RBC: 4.84 MIL/uL (ref 4.22–5.81)
RDW: 12.2 % (ref 11.5–15.5)
WBC: 6.1 10*3/uL (ref 4.0–10.5)
nRBC: 0 % (ref 0.0–0.2)

## 2022-08-09 LAB — COMPREHENSIVE METABOLIC PANEL
ALT: 39 U/L (ref 0–44)
AST: 33 U/L (ref 15–41)
Albumin: 4.3 g/dL (ref 3.5–5.0)
Alkaline Phosphatase: 136 U/L — ABNORMAL HIGH (ref 38–126)
Anion gap: 11 (ref 5–15)
BUN: 17 mg/dL (ref 6–20)
CO2: 24 mmol/L (ref 22–32)
Calcium: 8.9 mg/dL (ref 8.9–10.3)
Chloride: 103 mmol/L (ref 98–111)
Creatinine, Ser: 0.79 mg/dL (ref 0.61–1.24)
GFR, Estimated: >60 mL/min (ref >60)
Glucose, Bld: 100 mg/dL — ABNORMAL HIGH (ref 70–99)
Potassium: 3.7 mmol/L (ref 3.5–5.1)
Sodium: 138 mmol/L (ref 135–145)
Total Bilirubin: 1.2 mg/dL (ref 0.3–1.2)
Total Protein: 7.4 g/dL (ref 6.5–8.1)

## 2022-08-09 LAB — D-DIMER, QUANTITATIVE: D-Dimer, Quant: 4 ug/mL-FEU — ABNORMAL HIGH (ref 0.00–0.50)

## 2022-08-09 LAB — PROTIME-INR
INR: 1.1 (ref 0.8–1.2)
Prothrombin Time: 14.1 seconds (ref 11.4–15.2)

## 2022-08-09 MED ORDER — IOHEXOL 350 MG/ML SOLN
100.0000 mL | Freq: Once | INTRAVENOUS | Status: AC | PRN
  Administered 2022-08-09: 100 mL via INTRAVENOUS

## 2022-08-09 NOTE — ED Notes (Signed)
US at the bedside

## 2022-08-09 NOTE — Discharge Instructions (Signed)
Your ultrasounds were negative for blood clot.  Your CT angiogram of your chest and your CT abdomen and pelvis were all normal.  Please have your D-dimer rechecked as an outpatient.  Please return for any new, worsening, or change in symptoms or other concerns.  It was a pleasure caring for you today.

## 2022-08-09 NOTE — ED Triage Notes (Signed)
Pt arrives to ED from [facility] urgent care. Pt sts that he has been having bilat leg swelling with petechia on the backs of the calf. Pt sts that they sent him over here for a R/O DVT.

## 2022-08-09 NOTE — ED Provider Notes (Signed)
[facility] Provider Note    Event Date/Time   First MD Initiated Contact with Patient 08/09/22 1520     (approximate)   History   Leg Swelling   HPI  Anthony [name] II is a 42 y.o. male who presents today for evaluation of bilateral lower extremity swelling.  Patient reports that he first noticed this 2 days ago.  He noticed a faint rash on the posterior part of his left calf.  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  He reports that the rash is very itchy.  He reports that he has been getting cramps in his abdomen as well.  No chest pain or shortness of breath.  No pleurisy.  No known injury.  No personal or family history of PE or DVT.  Patient Active Problem List   Diagnosis Date Noted   Closed fracture of nasal bones    Open frontal sinus fracture    Acute diverticulitis 04/25/2020          Physical Exam   Triage Vital Signs: ED Triage Vitals [08/09/22 1437]  Enc Vitals Group     BP 134/88     Pulse Rate 83     Resp 17     Temp 97.8 F (36.6 C)     Temp Source Oral     SpO2 95 %     Weight (!) 322 lb (146.1 kg)     Height      Head Circumference      Peak Flow      Pain Score 5     Pain Loc      Pain Edu?      Excl. in GC?     Most recent vital signs: Vitals:   08/09/22 1437 08/09/22 2032  BP: 134/88 130/84  Pulse: 83 74  Resp: 17 18  Temp: 97.8 F (36.6 C) 98.6 F (37 C)  SpO2: 95% 96%    Physical Exam Vitals and nursing note reviewed.  Constitutional:      General: Awake and alert. No acute distress.    Appearance: Normal appearance. The patient is obese.  HENT:     Head: Normocephalic and atraumatic.     Mouth: Mucous membranes are moist.  Eyes:     General: PERRL. Normal EOMs        Right eye: No discharge.        Left eye: No discharge.     Conjunctiva/sclera: Conjunctivae normal.   Cardiovascular:     Rate and Rhythm: Normal rate and regular rhythm.     Pulses: Normal pulses.  Pulmonary:     Effort: Pulmonary effort is normal. No respiratory distress.     Breath sounds: Normal breath sounds.  Abdominal:     Abdomen is soft. There is no abdominal tenderness. No rebound or guarding. No distention.  No tenderness diffusely. Musculoskeletal:        General: No swelling. Normal range of motion.     Cervical back: Normal range of motion and neck supple.  Legs equal in circumference bilaterally.  Mild tenderness to bilateral posterior calves.  There is a chronic appearing erythematous rash to his left posterior calf, though no purpura or petechiae, no blistering or bullae, no skin sloughing, no wounds.  2+ pedal pulses bilaterally.  Compartments are soft and compressible throughout.  No pitting edema.  Full and normal range of motion of ankles, knees, hips bilaterally. Skin:    General: Skin is warm and dry.     Capillary Refill: Capillary refill takes less than 2 seconds.     Findings: No rash.  Neurological:     Mental Status: The patient is awake and alert.      ED Results / Procedures / Treatments   Labs (all labs ordered are listed, but only abnormal results are displayed) Labs Reviewed  COMPREHENSIVE METABOLIC PANEL - Abnormal; Notable for the following components:      Result Value   Glucose, Bld 100 (*)    Alkaline Phosphatase 136 (*)    All other components within normal limits  D-DIMER, QUANTITATIVE - Abnormal; Notable for the following components:   D-Dimer, Quant 4.00 (*)    All other components within normal limits  CBC  PROTIME-INR     EKG     RADIOLOGY I independently reviewed and interpreted imaging and agree with radiologists findings.     PROCEDURES:  Critical Care performed:   Procedures   MEDICATIONS ORDERED IN ED: Medications  iohexol (OMNIPAQUE) 350 MG/ML injection 100 mL (100 mLs Intravenous Contrast Given 08/09/22  1913)     IMPRESSION / MDM / ASSESSMENT AND PLAN / ED COURSE  I reviewed the triage vital signs and the nursing notes.   Differential diagnosis includes, but is not limited to, DVT, rash, thrombophlebitis, popliteal cyst, allergic reaction.  I reviewed the patient's chart.  Patient has seen orthopedics multiple times in the past for swelling and pain in his joints, thought to be secondary to osteoarthritis or rheumatoid arthritis.  His discomfort is in the same location today.  Patient presents emergency department awake and alert, hemodynamically stable and afebrile.  He is nontoxic in appearance.  He is a chronic appearing rash to his bilateral lower extremities though left worse than right.  Patient reports that it is very itchy.  There are no vesicles, bullae, skin sloughing noted.  No purpura.  No mucosal findings.  Not consistent with SJS or TENS.  No rash elsewhere on his body.  Rashes not consistent with phlegmasia cerulea dolens.  He has 2+ pedal pulses bilaterally and normal range of motion.  No joint pain.  Labs obtained in triage are overall reassuring with the exception of an elevated D-dimer.  His H&H is stable, platelets are within normal limits, not consistent with DIC.  Given his bilateral calf complaints and his elevated D-dimer, ultrasound was obtained and is negative for DVT bilaterally.  Patient complained to the ultrasound tech that he also wanted to have his abdomen evaluated because he has a history of diverticulitis and his faint abdominal cramping over the last couple of days.  CT abdomen and pelvis was obtained as well, in addition to CT angiogram given his elevated D-dimer.  However, he has no chest pain or shortness of breath.  CT scans were overall reassuring.  I had originally ordered a CT angiogram of the abdomen pelvis given his elevated D-dimer, however after discussion with radiologist about concerns for clots, they felt they were able to evaluate all that was needed  with a CT abdomen and pelvis.  Discussed all findings with the patient.  I recommended follow-up with his outpatient provider for recheck of his D-dimer.  Given the concern about rheumatological problems in the past,, recommended that he reestablish care with a rheumatologist.  He agrees to do so.  We discussed return precautions  and the importance of close outpatient follow-up.  Patient understands and agrees with plan.  He was discharged in stable condition.  Patient's presentation is most consistent with acute presentation with potential threat to life or bodily function.     FINAL CLINICAL IMPRESSION(S) / ED DIAGNOSES   Final diagnoses:  Leg swelling  Rash     Rx / DC Orders   ED Discharge Orders     None        Note:  This document was prepared using Dragon voice recognition software and may include unintentional dictation errors.   Keturah Shavers 08/09/22 2038    Pilar Jarvis, MD 08/11/22 [phone]

## 2023-04-03 ENCOUNTER — Other Ambulatory Visit: Payer: Self-pay

## 2023-04-03 ENCOUNTER — Emergency Department: Payer: Medicaid Other

## 2023-04-03 ENCOUNTER — Encounter: Payer: Self-pay | Admitting: Emergency Medicine

## 2023-04-03 DIAGNOSIS — M7989 Other specified soft tissue disorders: Secondary | ICD-10-CM | POA: Diagnosis present

## 2023-04-03 DIAGNOSIS — Z5321 Procedure and treatment not carried out due to patient leaving prior to being seen by health care provider: Secondary | ICD-10-CM | POA: Diagnosis not present

## 2023-04-03 LAB — BASIC METABOLIC PANEL
Anion gap: 8 (ref 5–15)
BUN: 18 mg/dL (ref 6–20)
CO2: 27 mmol/L (ref 22–32)
Calcium: 8.9 mg/dL (ref 8.9–10.3)
Chloride: 105 mmol/L (ref 98–111)
Creatinine, Ser: 1.02 mg/dL (ref 0.61–1.24)
GFR, Estimated: >60 mL/min (ref >60)
Glucose, Bld: 162 mg/dL — ABNORMAL HIGH (ref 70–99)
Potassium: 3.7 mmol/L (ref 3.5–5.1)
Sodium: 140 mmol/L (ref 135–145)

## 2023-04-03 LAB — CBC
HCT: 43.6 % (ref 39.0–52.0)
Hemoglobin: 14.8 g/dL (ref 13.0–17.0)
MCH: 31.4 pg (ref 26.0–34.0)
MCHC: 33.9 g/dL (ref 30.0–36.0)
MCV: 92.6 fL (ref 80.0–100.0)
Platelets: 182 10*3/uL (ref 150–400)
RBC: 4.71 MIL/uL (ref 4.22–5.81)
RDW: 12.4 % (ref 11.5–15.5)
WBC: 8 10*3/uL (ref 4.0–10.5)
nRBC: 0 % (ref 0.0–0.2)

## 2023-04-03 LAB — URINALYSIS, ROUTINE W REFLEX MICROSCOPIC
Bilirubin Urine: NEGATIVE
Glucose, UA: NEGATIVE mg/dL
Hgb urine dipstick: NEGATIVE
Ketones, ur: NEGATIVE mg/dL
Leukocytes,Ua: NEGATIVE
Nitrite: NEGATIVE
Protein, ur: NEGATIVE mg/dL
Specific Gravity, Urine: 1.021 (ref 1.005–1.030)
pH: 6 (ref 5.0–8.0)

## 2023-04-03 LAB — TROPONIN I (HIGH SENSITIVITY): Troponin I (High Sensitivity): 6 ng/L (ref <18)

## 2023-04-03 LAB — BRAIN NATRIURETIC PEPTIDE: B Natriuretic Peptide: 26.1 pg/mL (ref 0.0–100.0)

## 2023-04-03 NOTE — ED Triage Notes (Signed)
Patient ambulatory to triage with complaints of worsening bilateral leg swelling. Patient was placed on furosemide by PCP a month and a half ago, states he ran out of meds a couple of days ago and just refilled his med today. Did not take meds. Denies CP and shortness of breath.

## 2023-04-04 ENCOUNTER — Emergency Department
Admission: EM | Admit: 2023-04-04 | Discharge: 2023-04-04 | Discharge status: Against Medical Advice | Payer: Medicaid Other | Attending: Emergency Medicine | Admitting: Emergency Medicine

## 2023-04-04 LAB — TROPONIN I (HIGH SENSITIVITY): Troponin I (High Sensitivity): 5 ng/L (ref <18)

## 2023-04-04 NOTE — ED Notes (Signed)
Patient to NF desk upset about wait time, states he wants to go to another hospital to be seen. INT removed, cannula intact, pressure dressing applied. Patient ambulatory with steady even gait on exit of ER.

## 2023-04-04 NOTE — ED Notes (Signed)
No answer when called several times from lobby 

## 2023-05-28 ENCOUNTER — Encounter
Admission: RE | Admit: 2023-05-28 | Discharge: 2023-05-28 | Disposition: A | Discharge status: Home / Self Care | Payer: Medicaid Other | Source: Ambulatory Visit | Attending: Surgery | Admitting: Surgery

## 2023-05-28 ENCOUNTER — Other Ambulatory Visit: Payer: Self-pay | Admitting: Surgery

## 2023-05-28 VITALS — BP 132/89 | HR 80 | Resp 14 | Ht 72.0 in | Wt 360.9 lb

## 2023-05-28 DIAGNOSIS — Z01812 Encounter for preprocedural laboratory examination: Secondary | ICD-10-CM | POA: Diagnosis present

## 2023-05-28 DIAGNOSIS — Z01818 Encounter for other preprocedural examination: Secondary | ICD-10-CM

## 2023-05-28 HISTORY — DX: Tobacco use: Z72.0

## 2023-05-28 HISTORY — DX: Localized edema: R60.0

## 2023-05-28 HISTORY — DX: Fracture of nasal bones, initial encounter for closed fracture: S02.2XXA

## 2023-05-28 HISTORY — DX: Morbid (severe) obesity due to excess calories: E66.01

## 2023-05-28 HISTORY — DX: Osteoarthritis of knee, unspecified: M17.9

## 2023-05-28 LAB — URINALYSIS, ROUTINE W REFLEX MICROSCOPIC
Bacteria, UA: NONE SEEN
Bilirubin Urine: NEGATIVE
Glucose, UA: 50 mg/dL — AB
Ketones, ur: NEGATIVE mg/dL
Leukocytes,Ua: NEGATIVE
Nitrite: NEGATIVE
Protein, ur: 30 mg/dL — AB
RBC / HPF: >50 RBC/hpf (ref 0–5)
Specific Gravity, Urine: 1.025 (ref 1.005–1.030)
Squamous Epithelial / HPF: 0 /[HPF] (ref 0–5)
pH: 5 (ref 5.0–8.0)

## 2023-05-28 LAB — SURGICAL PCR SCREEN
MRSA, PCR: NEGATIVE
Staphylococcus aureus: POSITIVE — AB

## 2023-05-28 NOTE — Patient Instructions (Addendum)
Your procedure is scheduled on:06-03-23 Tuesday Report to the Registration Desk on the 1st floor of the [facility].Then proceed to the 2nd floor Surgery Desk To find out your arrival time, please call [phone] between 1PM - 3PM on:06-02-23 Monday If your arrival time is 6:00 am, do not arrive before that time as the [facility] entrance doors do not open until 6:00 am.  REMEMBER: Instructions that are not followed completely may result in serious medical risk, up to and including death; or upon the discretion of your surgeon and anesthesiologist your surgery may need to be rescheduled.  Do not eat food after midnight the night before surgery.  No gum chewing or hard candies.  You may however, drink CLEAR liquids up to 2 hours before you are scheduled to arrive for your surgery. Do not drink anything within 2 hours of your scheduled arrival time.  Clear liquids include: - water  - apple juice without pulp - gatorade (not RED colors) - black coffee or tea (Do NOT add milk or creamers to the coffee or tea) Do NOT drink anything that is not on this list.  In addition, your doctor has ordered for you to drink the provided:  Ensure Pre-Surgery Clear Carbohydrate Drink  Drinking this carbohydrate drink up to two hours before surgery helps to reduce insulin resistance and improve patient outcomes. Please complete drinking 2 hours before scheduled arrival time.  One week prior to surgery:Stop NOW (05-28-23) Stop Anti-inflammatories (NSAIDS) such as Advil, Aleve, Ibuprofen, Motrin, Naproxen, Naprosyn and Aspirin based products such as Excedrin, Goody's Powder, BC Powder.You may continue celecoxib (CELEBREX) up until the day prior to surgery Stop ANY OVER THE COUNTER supplements until after surgery.  You may however, continue to take Tylenol if needed for pain up until the day of surgery.  Continue taking all of your other prescription medications up until the day of surgery.  ON THE DAY OF  SURGERY ONLY TAKE THESE MEDICATIONS WITH SIPS OF WATER: -amLODipine (NORVASC)  -pantoprazole (PROTONIX)   No Alcohol for 24 hours before or after surgery.  No Smoking including e-cigarettes for 24 hours before surgery.  No chewable tobacco products for at least 6 hours before surgery.  No nicotine patches on the day of surgery.  Do not use any "recreational" drugs for at least a week (preferably 2 weeks) before your surgery.  Please be advised that the combination of cocaine and anesthesia may have negative outcomes, up to and including death. If you test positive for cocaine, your surgery will be cancelled.  On the morning of surgery brush your teeth with toothpaste and water, you may rinse your mouth with mouthwash if you wish. Do not swallow any toothpaste or mouthwash.  Use CHG Soap as directed on instruction sheet.  Do not wear jewelry, make-up, hairpins, clips or nail polish.  For welded (permanent) jewelry: bracelets, anklets, waist bands, etc.  Please have this removed prior to surgery.  If it is not removed, there is a chance that hospital personnel will need to cut it off on the day of surgery.  Do not wear lotions, powders, or perfumes.   Do not shave body hair from the neck down 48 hours before surgery.  Contact lenses, hearing aids and dentures may not be worn into surgery.  Do not bring valuables to the hospital. [facility] is not responsible for any missing/lost belongings or valuables.   Notify your doctor if there is any change in your medical condition (cold, fever,  infection).  Wear comfortable clothing (specific to your surgery type) to the hospital.  After surgery, you can help prevent lung complications by doing breathing exercises.  Take deep breaths and cough every 1-2 hours. Your doctor may order a device called an Incentive Spirometer to help you take deep breaths. When coughing or sneezing, hold a pillow firmly against your incision with both hands.  This is called "splinting." Doing this helps protect your incision. It also decreases belly discomfort.  If you are being admitted to the hospital overnight, leave your suitcase in the car. After surgery it may be brought to your room.  In case of increased patient census, it may be necessary for you, the patient, to continue your postoperative care in the Same Day Surgery department.  If you are being discharged the day of surgery, you will not be allowed to drive home. You will need a responsible individual to drive you home and stay with you for 24 hours after surgery.   If you are taking public transportation, you will need to have a responsible individual with you.  Please call the Pre-admissions Testing Dept. at [phone] if you have any questions about these instructions.  Surgery Visitation Policy:  Patients having surgery or a procedure may have two visitors.  Children under the age of 105 must have an adult with them who is not the patient.  Temporary Visitor Restrictions Due to increasing cases of flu, RSV and COVID-19: Children ages 39 and under will not be able to visit patients in [facility] hospitals under most circumstances.  Inpatient Visitation:    Visiting hours are 7 a.m. to 8 p.m. Up to four visitors are allowed at one time in a patient room. The visitors may rotate out with other people during the day.  One visitor age 36 or older may stay with the patient overnight and must be in the room by 8 p.m.    Pre-operative 5 CHG Bath Instructions   You can play a key role in reducing the risk of infection after surgery. Your skin needs to be as free of germs as possible. You can reduce the number of germs on your skin by washing with CHG (chlorhexidine gluconate) soap before surgery. CHG is an antiseptic soap that kills germs and continues to kill germs even after washing.   DO NOT use if you have an allergy to chlorhexidine/CHG or antibacterial soaps. If your  skin becomes reddened or irritated, stop using the CHG and notify one of our RNs at [phone].   Please shower with the CHG soap starting 4 days before surgery using the following schedule:     Please keep in mind the following:  DO NOT shave, including legs and underarms, starting the day of your first shower.   You may shave your face at any point before/day of surgery.  Place clean sheets on your bed the day you start using CHG soap. Use a clean washcloth (not used since being washed) for each shower. DO NOT sleep with pets once you start using the CHG.   CHG Shower Instructions:  If you choose to wash your hair and private area, wash first with your normal shampoo/soap.  After you use shampoo/soap, rinse your hair and body thoroughly to remove shampoo/soap residue.  Turn the water OFF and apply about 3 tablespoons (45 ml) of CHG soap to a CLEAN washcloth.  Apply CHG soap ONLY FROM YOUR NECK DOWN TO YOUR TOES (washing for 3-5 minutes)  DO NOT use CHG soap on face, private areas, open wounds, or sores.  Pay special attention to the area where your surgery is being performed.  If you are having back surgery, having someone wash your back for you may be helpful. Wait 2 minutes after CHG soap is applied, then you may rinse off the CHG soap.  Pat dry with a clean towel  Put on clean clothes/pajamas   If you choose to wear lotion, please use ONLY the CHG-compatible lotions on the back of this paper.     Additional instructions for the day of surgery: DO NOT APPLY any lotions, deodorants, cologne, or perfumes.   Put on clean/comfortable clothes.  Brush your teeth.  Ask your nurse before applying any prescription medications to the skin.      CHG Compatible Lotions   Aveeno Moisturizing lotion  Cetaphil Moisturizing Cream  Cetaphil Moisturizing Lotion  Clairol Herbal Essence Moisturizing Lotion, Dry Skin  Clairol Herbal Essence Moisturizing Lotion, Extra Dry Skin  Clairol  Herbal Essence Moisturizing Lotion, Normal Skin  Curel Age Defying Therapeutic Moisturizing Lotion with Alpha Hydroxy  Curel Extreme Care Body Lotion  Curel Soothing Hands Moisturizing Hand Lotion  Curel Therapeutic Moisturizing Cream, Fragrance-Free  Curel Therapeutic Moisturizing Lotion, Fragrance-Free  Curel Therapeutic Moisturizing Lotion, Original Formula  Eucerin Daily Replenishing Lotion  Eucerin Dry Skin Therapy Plus Alpha Hydroxy Crme  Eucerin Dry Skin Therapy Plus Alpha Hydroxy Lotion  Eucerin Original Crme  Eucerin Original Lotion  Eucerin Plus Crme Eucerin Plus Lotion  Eucerin TriLipid Replenishing Lotion  Keri Anti-Bacterial Hand Lotion  Keri Deep Conditioning Original Lotion Dry Skin Formula Softly Scented  Keri Deep Conditioning Original Lotion, Fragrance Free Sensitive Skin Formula  Keri Lotion Fast Absorbing Fragrance Free Sensitive Skin Formula  Keri Lotion Fast Absorbing Softly Scented Dry Skin Formula  Keri Original Lotion  Keri Skin Renewal Lotion Keri Silky Smooth Lotion  Keri Silky Smooth Sensitive Skin Lotion  Nivea Body Creamy Conditioning Oil  Nivea Body Extra Enriched Lotion  Nivea Body Original Lotion  Nivea Body Sheer Moisturizing Lotion Nivea Crme  Nivea Skin Firming Lotion  NutraDerm 30 Skin Lotion  NutraDerm Skin Lotion  NutraDerm Therapeutic Skin Cream  NutraDerm Therapeutic Skin Lotion  ProShield Protective Hand Cream  Provon moisturizing lotion  How to Use an Incentive Spirometer An incentive spirometer is a tool that measures how well you are filling your lungs with each breath. Learning to take long, deep breaths using this tool can help you keep your lungs clear and active. This may help to reverse or lessen your chance of developing breathing (pulmonary) problems, especially infection. You may be asked to use a spirometer: After a surgery. If you have a lung problem or a history of smoking. After a long period of time when you have been  unable to move or be active. If the spirometer includes an indicator to show the highest number that you have reached, your health care provider or respiratory therapist will help you set a goal. Keep a log of your progress as told by your health care provider. What are the risks? Breathing too quickly may cause dizziness or cause you to pass out. Take your time so you do not get dizzy or light-headed. If you are in pain, you may need to take pain medicine before doing incentive spirometry. It is harder to take a deep breath if you are having pain. How to use your incentive spirometer  Sit up on the edge of your bed  or on a chair. Hold the incentive spirometer so that it is in an upright position. Before you use the spirometer, breathe out normally. Place the mouthpiece in your mouth. Make sure your lips are closed tightly around it. Breathe in slowly and as deeply as you can through your mouth, causing the piston or the ball to rise toward the top of the chamber. Hold your breath for 3-5 seconds, or for as long as possible. If the spirometer includes a coach indicator, use this to guide you in breathing. Slow down your breathing if the indicator goes above the marked areas. Remove the mouthpiece from your mouth and breathe out normally. The piston or ball will return to the bottom of the chamber. Rest for a few seconds, then repeat the steps 10 or more times. Take your time and take a few normal breaths between deep breaths so that you do not get dizzy or light-headed. Do this every 1-2 hours when you are awake. If the spirometer includes a goal marker to show the highest number you have reached (best effort), use this as a goal to work toward during each repetition. After each set of 10 deep breaths, cough a few times. This will help to make sure that your lungs are clear. If you have an incision on your chest or abdomen from surgery, place a pillow or a rolled-up towel firmly against the  incision when you cough. This can help to reduce pain while taking deep breaths and coughing. General tips When you are able to get out of bed: Walk around often. Continue to take deep breaths and cough in order to clear your lungs. Keep using the incentive spirometer until your health care provider says it is okay to stop using it. If you have been in the hospital, you may be told to keep using the spirometer at home. Contact a health care provider if: You are having difficulty using the spirometer. You have trouble using the spirometer as often as instructed. Your pain medicine is not giving enough relief for you to use the spirometer as told. You have a fever. Get help right away if: You develop shortness of breath. You develop a cough with bloody mucus from the lungs. You have fluid or blood coming from an incision site after you cough. Summary An incentive spirometer is a tool that can help you learn to take long, deep breaths to keep your lungs clear and active. You may be asked to use a spirometer after a surgery, if you have a lung problem or a history of smoking, or if you have been inactive for a long period of time. Use your incentive spirometer as instructed every 1-2 hours while you are awake. If you have an incision on your chest or abdomen, place a pillow or a rolled-up towel firmly against your incision when you cough. This will help to reduce pain. Get help right away if you have shortness of breath, you cough up bloody mucus, or blood comes from your incision when you cough. This information is not intended to replace advice given to you by your health care provider. Make sure you discuss any questions you have with your health care provider. Document Revised: 02/21/2023 Document Reviewed: 02/21/2023 Elsevier Patient Education  2024 Elsevier Inc.  Preoperative Educational Videos for Total Hip, Knee and Shoulder Replacements  To better prepare for surgery, please view our  videos that explain the physical activity and discharge planning required to have the best surgical recovery  at [facility].  [email or web address]  Questions? Call [phone] or email [email or web address]

## 2023-06-03 ENCOUNTER — Encounter: Payer: Self-pay | Admitting: Surgery

## 2023-06-03 ENCOUNTER — Ambulatory Visit: Payer: Medicaid Other | Admitting: Certified Registered"

## 2023-06-03 ENCOUNTER — Other Ambulatory Visit: Payer: Self-pay

## 2023-06-03 ENCOUNTER — Ambulatory Visit
Admission: RE | Admit: 2023-06-03 | Discharge: 2023-06-03 | Disposition: A | Discharge status: Home / Self Care | Payer: Medicaid Other | Source: Ambulatory Visit | Attending: Surgery | Admitting: Surgery

## 2023-06-03 ENCOUNTER — Ambulatory Visit: Payer: Medicaid Other

## 2023-06-03 ENCOUNTER — Encounter
Admission: RE | Disposition: A | Discharge status: Home / Self Care | Payer: Self-pay | Source: Ambulatory Visit | Attending: Surgery

## 2023-06-03 ENCOUNTER — Ambulatory Visit: Payer: Medicaid Other | Admitting: Urgent Care

## 2023-06-03 DIAGNOSIS — I10 Essential (primary) hypertension: Secondary | ICD-10-CM | POA: Diagnosis not present

## 2023-06-03 DIAGNOSIS — F419 Anxiety disorder, unspecified: Secondary | ICD-10-CM | POA: Insufficient documentation

## 2023-06-03 DIAGNOSIS — Z79899 Other long term (current) drug therapy: Secondary | ICD-10-CM | POA: Insufficient documentation

## 2023-06-03 DIAGNOSIS — F17200 Nicotine dependence, unspecified, uncomplicated: Secondary | ICD-10-CM | POA: Insufficient documentation

## 2023-06-03 DIAGNOSIS — Z6841 Body Mass Index (BMI) 40.0 and over, adult: Secondary | ICD-10-CM | POA: Diagnosis not present

## 2023-06-03 DIAGNOSIS — M1712 Unilateral primary osteoarthritis, left knee: Secondary | ICD-10-CM | POA: Insufficient documentation

## 2023-06-03 DIAGNOSIS — E66813 Obesity, class 3: Secondary | ICD-10-CM | POA: Insufficient documentation

## 2023-06-03 HISTORY — PX: TOTAL KNEE ARTHROPLASTY: SHX125

## 2023-06-03 SURGERY — ARTHROPLASTY, KNEE, TOTAL
Anesthesia: Spinal | Site: Knee | Laterality: Left

## 2023-06-03 MED ORDER — KETAMINE HCL 50 MG/5ML IJ SOSY
PREFILLED_SYRINGE | INTRAMUSCULAR | Status: AC
  Filled 2023-06-03: qty 5

## 2023-06-03 MED ORDER — KETOROLAC TROMETHAMINE 30 MG/ML IJ SOLN
INTRAMUSCULAR | Status: AC
  Filled 2023-06-03: qty 1

## 2023-06-03 MED ORDER — CEFAZOLIN SODIUM-DEXTROSE 2-4 GM/100ML-% IV SOLN
INTRAVENOUS | Status: AC
  Filled 2023-06-03: qty 100

## 2023-06-03 MED ORDER — SODIUM CHLORIDE 0.9 % BOLUS PEDS: 250.0000 mL | Freq: Once | INTRAVENOUS | Status: AC

## 2023-06-03 MED ORDER — FENTANYL CITRATE (PF) 100 MCG/2ML IJ SOLN
INTRAMUSCULAR | Status: DC | PRN
  Administered 2023-06-03: 50 ug via INTRAVENOUS

## 2023-06-03 MED ORDER — TRIAMCINOLONE ACETONIDE 40 MG/ML IJ SUSP
INTRAMUSCULAR | Status: DC | PRN
  Administered 2023-06-03: 93 mL via INTRAMUSCULAR

## 2023-06-03 MED ORDER — TRANEXAMIC ACID-NACL 1000-0.7 MG/100ML-% IV SOLN
INTRAVENOUS | Status: AC
  Filled 2023-06-03: qty 100

## 2023-06-03 MED ORDER — OXYCODONE HCL 5 MG PO TABS
5.0000 mg | ORAL_TABLET | ORAL | Status: DC | PRN
Start: 2023-06-03 — End: 2023-06-03

## 2023-06-03 MED ORDER — OXYCODONE HCL 5 MG PO TABS: 5.0000 mg | ORAL_TABLET | Freq: Once | ORAL | Status: DC | PRN

## 2023-06-03 MED ORDER — ACETAMINOPHEN 10 MG/ML IV SOLN: 1000.0000 mg | Freq: Once | INTRAVENOUS | Status: DC | PRN

## 2023-06-03 MED ORDER — LIDOCAINE HCL (PF) 2 % IJ SOLN
INTRAMUSCULAR | Status: AC
  Filled 2023-06-03: qty 5

## 2023-06-03 MED ORDER — CHLORHEXIDINE GLUCONATE 0.12 % MT SOLN
15.0000 mL | Freq: Once | OROMUCOSAL | Status: AC
  Administered 2023-06-03: 15 mL via OROMUCOSAL

## 2023-06-03 MED ORDER — LISINOPRIL 40 MG PO TABS: 40.0000 mg | ORAL_TABLET | ORAL | Status: AC

## 2023-06-03 MED ORDER — ORAL CARE MOUTH RINSE: 15.0000 mL | Freq: Once | OROMUCOSAL | Status: AC

## 2023-06-03 MED ORDER — SODIUM CHLORIDE 0.9 % IR SOLN
Status: DC | PRN
  Administered 2023-06-03: 3000 mL

## 2023-06-03 MED ORDER — SODIUM CHLORIDE (PF) 0.9 % IJ SOLN: INTRAMUSCULAR | Status: DC | PRN

## 2023-06-03 MED ORDER — MIDAZOLAM HCL 5 MG/5ML IJ SOLN
INTRAMUSCULAR | Status: DC | PRN
  Administered 2023-06-03: 2 mg via INTRAVENOUS

## 2023-06-03 MED ORDER — CEFAZOLIN SODIUM-DEXTROSE 3-4 GM/150ML-% IV SOLN
3.0000 g | Freq: Four times a day (QID) | INTRAVENOUS | Status: DC
  Administered 2023-06-03: 3 g via INTRAVENOUS
  Filled 2023-06-03: qty 150

## 2023-06-03 MED ORDER — ONDANSETRON HCL 4 MG/2ML IJ SOLN
INTRAMUSCULAR | Status: AC
  Filled 2023-06-03: qty 2

## 2023-06-03 MED ORDER — APIXABAN 2.5 MG PO TABS: 2.5000 mg | ORAL_TABLET | Freq: Two times a day (BID) | ORAL | 0 refills | Status: AC

## 2023-06-03 MED ORDER — FENTANYL CITRATE (PF) 100 MCG/2ML IJ SOLN: 25.0000 ug | INTRAMUSCULAR | Status: DC | PRN

## 2023-06-03 MED ORDER — ACETAMINOPHEN 10 MG/ML IV SOLN
INTRAVENOUS | Status: AC
  Filled 2023-06-03: qty 100

## 2023-06-03 MED ORDER — BUPIVACAINE-EPINEPHRINE (PF) 0.5% -1:200000 IJ SOLN
INTRAMUSCULAR | Status: AC
  Filled 2023-06-03: qty 10

## 2023-06-03 MED ORDER — MIDAZOLAM HCL 2 MG/2ML IJ SOLN
INTRAMUSCULAR | Status: AC
Start: 2023-06-03 — End: ?
  Filled 2023-06-03: qty 2

## 2023-06-03 MED ORDER — SODIUM CHLORIDE 0.9 % BOLUS PEDS
250.0000 mL | Freq: Once | INTRAVENOUS | Status: AC
  Administered 2023-06-03: 250 mL via INTRAVENOUS

## 2023-06-03 MED ORDER — DROPERIDOL 2.5 MG/ML IJ SOLN: 0.6250 mg | Freq: Once | INTRAMUSCULAR | Status: DC | PRN

## 2023-06-03 MED ORDER — CHLORHEXIDINE GLUCONATE 4 % EX SOLN: 1.0000 | CUTANEOUS | 1 refills | Status: AC

## 2023-06-03 MED ORDER — BUPIVACAINE HCL (PF) 0.5 % IJ SOLN
INTRAMUSCULAR | Status: DC | PRN
  Administered 2023-06-03: 2.8 mL via INTRATHECAL

## 2023-06-03 MED ORDER — METOCLOPRAMIDE HCL 10 MG PO TABS
5.0000 mg | ORAL_TABLET | Freq: Three times a day (TID) | ORAL | Status: DC | PRN
Start: 2023-06-03 — End: 2023-06-03

## 2023-06-03 MED ORDER — LIDOCAINE HCL (CARDIAC) PF 100 MG/5ML IV SOSY
PREFILLED_SYRINGE | INTRAVENOUS | Status: DC | PRN
  Administered 2023-06-03: 40 mg via INTRAVENOUS

## 2023-06-03 MED ORDER — OXYCODONE HCL 5 MG/5ML PO SOLN: 5.0000 mg | Freq: Once | ORAL | Status: DC | PRN

## 2023-06-03 MED ORDER — DEXAMETHASONE SODIUM PHOSPHATE 10 MG/ML IJ SOLN
INTRAMUSCULAR | Status: DC | PRN
  Administered 2023-06-03: 10 mg via INTRAVENOUS

## 2023-06-03 MED ORDER — PROPOFOL 1000 MG/100ML IV EMUL
INTRAVENOUS | Status: AC
  Filled 2023-06-03: qty 100

## 2023-06-03 MED ORDER — DEXMEDETOMIDINE HCL IN NACL 80 MCG/20ML IV SOLN
INTRAVENOUS | Status: DC | PRN
  Administered 2023-06-03 (×2): 8 ug via INTRAVENOUS

## 2023-06-03 MED ORDER — KETOROLAC TROMETHAMINE 30 MG/ML IJ SOLN
30.0000 mg | Freq: Once | INTRAMUSCULAR | Status: AC
  Administered 2023-06-03: 30 mg via INTRAVENOUS

## 2023-06-03 MED ORDER — METOCLOPRAMIDE HCL 5 MG/ML IJ SOLN: 5.0000 mg | Freq: Three times a day (TID) | INTRAMUSCULAR | Status: DC | PRN

## 2023-06-03 MED ORDER — DEXMEDETOMIDINE HCL IN NACL 80 MCG/20ML IV SOLN
INTRAVENOUS | Status: AC
  Filled 2023-06-03: qty 20

## 2023-06-03 MED ORDER — ACETAMINOPHEN 325 MG PO TABS: 325.0000 mg | ORAL_TABLET | Freq: Four times a day (QID) | ORAL | Status: DC | PRN

## 2023-06-03 MED ORDER — TRANEXAMIC ACID-NACL 1000-0.7 MG/100ML-% IV SOLN
1000.0000 mg | INTRAVENOUS | Status: AC
  Administered 2023-06-03: 1000 mg via INTRAVENOUS

## 2023-06-03 MED ORDER — CHLORHEXIDINE GLUCONATE 0.12 % MT SOLN
OROMUCOSAL | Status: AC
  Filled 2023-06-03: qty 15

## 2023-06-03 MED ORDER — CEFAZOLIN SODIUM-DEXTROSE 3-4 GM/150ML-% IV SOLN
3.0000 g | INTRAVENOUS | Status: AC
  Administered 2023-06-03: 3 g via INTRAVENOUS
  Filled 2023-06-03: qty 150

## 2023-06-03 MED ORDER — DEXTROSE-SODIUM CHLORIDE 5-0.9 % IV SOLN: INTRAVENOUS | Status: DC

## 2023-06-03 MED ORDER — OXYCODONE HCL 5 MG PO TABS: 5.0000 mg | ORAL_TABLET | ORAL | 0 refills | Status: AC | PRN

## 2023-06-03 MED ORDER — VANCOMYCIN HCL IN DEXTROSE 1-5 GM/200ML-% IV SOLN
1000.0000 mg | Freq: Once | INTRAVENOUS | Status: AC
Start: 2023-06-03 — End: 2023-06-03
  Administered 2023-06-03: 1000 mg via INTRAVENOUS

## 2023-06-03 MED ORDER — FENTANYL CITRATE (PF) 100 MCG/2ML IJ SOLN
INTRAMUSCULAR | Status: AC
  Filled 2023-06-03: qty 2

## 2023-06-03 MED ORDER — ACETAMINOPHEN 10 MG/ML IV SOLN
INTRAVENOUS | Status: DC | PRN
  Administered 2023-06-03: 1000 mg via INTRAVENOUS

## 2023-06-03 MED ORDER — KETAMINE HCL 50 MG/5ML IJ SOSY
PREFILLED_SYRINGE | INTRAMUSCULAR | Status: DC | PRN
  Administered 2023-06-03: 20 mg via INTRAVENOUS
  Administered 2023-06-03: 10 mg via INTRAVENOUS

## 2023-06-03 MED ORDER — BUPIVACAINE LIPOSOME 1.3 % IJ SUSP
INTRAMUSCULAR | Status: AC
  Filled 2023-06-03: qty 20

## 2023-06-03 MED ORDER — PHENYLEPHRINE HCL-NACL 20-0.9 MG/250ML-% IV SOLN
INTRAVENOUS | Status: AC
  Filled 2023-06-03: qty 250

## 2023-06-03 MED ORDER — GLYCOPYRROLATE 0.2 MG/ML IJ SOLN
INTRAMUSCULAR | Status: AC
  Filled 2023-06-03: qty 1

## 2023-06-03 MED ORDER — DEXAMETHASONE SODIUM PHOSPHATE 10 MG/ML IJ SOLN
INTRAMUSCULAR | Status: AC
  Filled 2023-06-03: qty 1

## 2023-06-03 MED ORDER — PROPOFOL 500 MG/50ML IV EMUL
INTRAVENOUS | Status: DC | PRN
  Administered 2023-06-03: 40 ug/kg/min via INTRAVENOUS

## 2023-06-03 MED ORDER — PHENYLEPHRINE HCL-NACL 20-0.9 MG/250ML-% IV SOLN
INTRAVENOUS | Status: DC | PRN
  Administered 2023-06-03: 50 ug/min via INTRAVENOUS

## 2023-06-03 MED ORDER — ONDANSETRON HCL 4 MG/2ML IJ SOLN
INTRAMUSCULAR | Status: DC | PRN
  Administered 2023-06-03: 4 mg via INTRAVENOUS

## 2023-06-03 MED ORDER — LACTATED RINGERS IV SOLN
INTRAVENOUS | Status: DC
Start: 2023-06-03 — End: 2023-06-03

## 2023-06-03 MED ORDER — BUPIVACAINE HCL (PF) 0.5 % IJ SOLN
INTRAMUSCULAR | Status: AC
  Filled 2023-06-03: qty 10

## 2023-06-03 MED ORDER — ONDANSETRON HCL 4 MG PO TABS: 4.0000 mg | ORAL_TABLET | Freq: Four times a day (QID) | ORAL | Status: DC | PRN

## 2023-06-03 MED ORDER — 0.9 % SODIUM CHLORIDE (POUR BTL) OPTIME
TOPICAL | Status: DC | PRN
  Administered 2023-06-03: 1000 mL

## 2023-06-03 MED ORDER — ONDANSETRON HCL 4 MG/2ML IJ SOLN
4.0000 mg | Freq: Four times a day (QID) | INTRAMUSCULAR | Status: DC | PRN
Start: 2023-06-03 — End: 2023-06-03

## 2023-06-03 MED ORDER — GLYCOPYRROLATE 0.2 MG/ML IJ SOLN
INTRAMUSCULAR | Status: DC | PRN
  Administered 2023-06-03: .1 mg via INTRAVENOUS

## 2023-06-03 MED ORDER — TRIAMCINOLONE ACETONIDE 40 MG/ML IJ SUSP
INTRAMUSCULAR | Status: AC
  Filled 2023-06-03: qty 1

## 2023-06-03 MED ORDER — VANCOMYCIN HCL IN DEXTROSE 1-5 GM/200ML-% IV SOLN
INTRAVENOUS | Status: AC
  Filled 2023-06-03: qty 200

## 2023-06-03 MED ORDER — MUPIROCIN 2 % EX OINT: 1.0000 | TOPICAL_OINTMENT | Freq: Two times a day (BID) | CUTANEOUS | 0 refills | Status: AC

## 2023-06-03 SURGICAL SUPPLY — 58 items
BLADE SAW 90X13X1.19 OSCILLAT (BLADE) ×1 IMPLANT
BLADE SAW SAG 25X90X1.19 (BLADE) ×1 IMPLANT
BLADE SURG SZ20 CARB STEEL (BLADE) ×1 IMPLANT
BNDG ELASTIC 6X5.8 VLCR NS LF (GAUZE/BANDAGES/DRESSINGS) ×1 IMPLANT
CEMENT VACUUM MIXING SYSTEM (MISCELLANEOUS) ×1 IMPLANT
CHLORAPREP W/TINT 26 (MISCELLANEOUS) ×1 IMPLANT
COMP FEM PS KNEE STD 10 LT (Knees) ×1 IMPLANT
COMP PATELLA 3 PEG 38 (Joint) ×1 IMPLANT
COMP TIB KNEE H 0D LT (Joint) ×1 IMPLANT
COMPONENT FEM PS KN STD 10 LT (Knees) IMPLANT
COMPONENT PATELLA 3 PEG 38 (Joint) IMPLANT
COMPONENT TIB KNEE H 0D LT (Joint) IMPLANT
COOLER POLAR GLACIER W/PUMP (MISCELLANEOUS) ×1 IMPLANT
COVER MAYO STAND STRL (DRAPES) ×1 IMPLANT
CUFF TRNQT CYL 24X4X16.5-23 (TOURNIQUET CUFF) IMPLANT
CUFF TRNQT CYL 34X4.125X (TOURNIQUET CUFF) IMPLANT
DRAPE IMP U-DRAPE 54X76 (DRAPES) ×1 IMPLANT
DRAPE SHEET LG 3/4 BI-LAMINATE (DRAPES) ×1 IMPLANT
DRAPE U-SHAPE 47X51 STRL (DRAPES) ×1 IMPLANT
DRSG MEPILEX SACRM 8.7X9.8 (GAUZE/BANDAGES/DRESSINGS) IMPLANT
DRSG OPSITE POSTOP 4X10 (GAUZE/BANDAGES/DRESSINGS) ×1 IMPLANT
DRSG OPSITE POSTOP 4X8 (GAUZE/BANDAGES/DRESSINGS) ×1 IMPLANT
ELECT CAUTERY BLADE 6.4 (BLADE) ×1 IMPLANT
ELECT REM PT RETURN 9FT ADLT (ELECTROSURGICAL) ×1
ELECTRODE REM PT RTRN 9FT ADLT (ELECTROSURGICAL) ×1 IMPLANT
GAUZE XEROFORM 1X8 LF (GAUZE/BANDAGES/DRESSINGS) ×1 IMPLANT
GLOVE BIO SURGEON STRL SZ7.5 (GLOVE) ×4 IMPLANT
GLOVE BIO SURGEON STRL SZ8 (GLOVE) ×4 IMPLANT
GLOVE BIOGEL PI IND STRL 8 (GLOVE) ×1 IMPLANT
GLOVE INDICATOR 8.0 STRL GRN (GLOVE) ×1 IMPLANT
GOWN STRL REUS W/ TWL LRG LVL3 (GOWN DISPOSABLE) ×1 IMPLANT
GOWN STRL REUS W/ TWL XL LVL3 (GOWN DISPOSABLE) ×1 IMPLANT
HOOD PEEL AWAY T7 (MISCELLANEOUS) ×3 IMPLANT
IV NS IRRIG 3000ML ARTHROMATIC (IV SOLUTION) ×1 IMPLANT
KIT TURNOVER KIT A (KITS) ×1 IMPLANT
LINER TIB ASF PS GH/8-11 14 LT (Liner) IMPLANT
MANIFOLD NEPTUNE II (INSTRUMENTS) ×1 IMPLANT
NDL SPNL 20GX3.5 QUINCKE YW (NEEDLE) ×1 IMPLANT
NEEDLE SPNL 20GX3.5 QUINCKE YW (NEEDLE) ×1 IMPLANT
NS IRRIG 500ML POUR BTL (IV SOLUTION) ×1 IMPLANT
PACK TOTAL KNEE (MISCELLANEOUS) ×1 IMPLANT
PAD IMPACTOR FEM PS DISP (ORTHOPEDIC DISPOSABLE SUPPLIES) IMPLANT
PAD WRAPON POLAR KNEE (MISCELLANEOUS) ×1 IMPLANT
PENCIL SMOKE EVACUATOR (MISCELLANEOUS) ×1 IMPLANT
PIN DRILL HDLS TROCAR 75 4PK (PIN) IMPLANT
PULSAVAC PLUS IRRIG FAN TIP (DISPOSABLE) ×1
SCREW FEMALE HEX FIX 25X2.5 (ORTHOPEDIC DISPOSABLE SUPPLIES) IMPLANT
STAPLER SKIN PROX 35W (STAPLE) ×1 IMPLANT
SUCTION TUBE FRAZIER 10FR DISP (SUCTIONS) ×1 IMPLANT
SUT VIC AB 0 CT1 36 (SUTURE) ×3 IMPLANT
SUT VIC AB 2-0 CT1 TAPERPNT 27 (SUTURE) ×3 IMPLANT
SYR 10ML LL (SYRINGE) ×1 IMPLANT
SYR 20ML LL LF (SYRINGE) ×1 IMPLANT
SYR 30ML LL (SYRINGE) IMPLANT
TIP FAN IRRIG PULSAVAC PLUS (DISPOSABLE) ×1 IMPLANT
TRAP FLUID SMOKE EVACUATOR (MISCELLANEOUS) ×1 IMPLANT
WATER STERILE IRR 500ML POUR (IV SOLUTION) ×1 IMPLANT
WRAPON POLAR PAD KNEE (MISCELLANEOUS) ×1

## 2023-06-03 NOTE — Transfer of Care (Signed)
 Immediate Anesthesia Transfer of Care Note  Patient: Anthony [name]  Procedure(s) Performed: TOTAL KNEE ARTHROPLASTY (Left: Knee)  Patient Location: PACU  Anesthesia Type:Spinal  Level of Consciousness: drowsy  Airway & Oxygen Therapy: Patient Spontanous Breathing and Patient connected to face mask oxygen  Post-op Assessment: Report given to RN and Post -op Vital signs reviewed and stable  Post vital signs: Reviewed and stable  Last Vitals:  Vitals Value Taken Time  BP 92/58 06/03/23 1319  Temp 35.9 1318  Pulse 79 06/03/23 1325  Resp 14 06/03/23 1325  SpO2 98 % 06/03/23 1325  Vitals shown include unfiled device data.  Last Pain:  Vitals:   06/03/23 0914  TempSrc: Temporal  PainSc: 0-No pain      Patients Stated Pain Goal: 0 (06/03/23 0914)  Complications: No notable events documented.

## 2023-06-03 NOTE — TOC Initial Note (Addendum)
 Transition of Care [facility]) - Initial/Assessment Note    Patient Details  Name: Anthony [name] MRN: [id number] Date of Birth: 02-26-1981  Transition of Care [facility]) CM/SW Contact:    Anthony JINNY Bernheim, RN Phone Number: 06/03/2023, 3:32 PM  Clinical Narrative:                                                    [place]  Is set up for [facility] prior to surgery by Surgeons office  RW and 3 in 1 requested from [employer] reports that he received both in July 2023 and to get another it woulodfied PTbe 150 each, I notified the nurse and requested her to let patient know, also notified The pT, the patient will need to pick up the RW at the office at [facility] supply [address] [facility] [address], they close at 5          Patient Goals and CMS Choice            Expected Discharge Plan and Services         Expected Discharge Date: 06/03/23                                    Prior Living Arrangements/Services                       Activities of Daily Living      Permission Sought/Granted                  Emotional Assessment              Admission diagnosis:  PRIMARY OSTEOARTHRITIS OF LEFT KNEE. Patient Active Problem List   Diagnosis Date Noted   Closed fracture of nasal bones    Open frontal sinus fracture (HCC)    Acute diverticulitis 04/25/2020   PCP:  Anthony Rock HERO, MD Pharmacy:   [facility] [address] [place] - [address] [place] [address] Phone: [phone] Fax: [phone]  [id number] [place], [place] - [address] [place] [address] Phone: [phone] Fax: [phone]     Social Drivers of Health (SDOH) Social History: SDOH Screenings   Social Connections: Unknown (09/06/2021)   Received from [facility]  Tobacco Use: High Risk (06/03/2023)   SDOH Interventions:     Readmission Risk Interventions     No data to display

## 2023-06-03 NOTE — Progress Notes (Signed)
Obtained LLE knee immobilizer as ordered by Dr. Joice Lofts.  Patient verbalized understanding and agrees to wait for PT to instruct/educate regarding importance of following instructions.

## 2023-06-03 NOTE — H&P (Signed)
 History of Present Illness: Anthony [name] is a 43 y.o. who presents today for a history and physical. He is to undergo a left total knee arthroplasty on 06/03/2023. Since his last visit at the clinic there have been no improvement in his condition. The patient expresses his desire to proceed with surgery.  The patient is well-known to me as he underwent a right total knee arthroplasty about 1.5 years ago, from which he has done quite well. He was noted to have fairly substantial significant degenerative joint disease of his left knee, but his symptoms were sufficiently manageable that he did not pursue knee replacement surgery at that time. Over the past month or so, his knee symptoms have worsened substantially, prompting him to make this appointment. He reports 9/10 pain. The pain is located along the anterior and medial aspect of the knee. The pain is described as aching, dull, stabbing, and throbbing. The symptoms are aggravated with normal daily activities, using stairs, at higher levels of activity, rising from a chair, walking, standing, and activity in general. He also describes no mechanical symptoms. He has associated swelling and deformity. He has tried Cymbalta, over-the-counter medications, anti-inflammatories, and a home exercise program with limited benefit.  Past Medical History: Arthritis  Asthma without status asthmaticus (HHS-HCC)  Diverticulitis 10/12/2015  Hypertension   Past Surgical History: Right shoulder 08/1999  Right TKA using Signature-guided all-cemented [employer] Vanguard system with a 70 mm PCR femur, an 83 mm tibial tray with a 14 mm anterior stabilized E-poly insert, and a 37 x 10 mm all-poly 3-pegged domed patella Right 11/13/2021 (Dr. Edie)   Past Family History: Arthritis Mother  Stroke Mother  Arthritis Father  Stroke Father   Medications: acetaminophen  (TYLENOL ) 650 MG ER tablet Take 1,300 mg by mouth 2 (two) times daily  amLODIPine (NORVASC) 5 MG tablet  Take 5 mg by mouth once daily for Blood Pressure  celecoxib  (CELEBREX ) 200 MG capsule TAKE 1 CAPSULE BY MOUTH TWICE A DAY 60 capsule 2  FUROsemide (LASIX) 20 MG tablet Take 1-2 tablets by mouth once daily as needed for Edema  lisinopriL  (ZESTRIL ) 40 MG tablet TAKE 1 AND 1/2 TABLETS BY MOUTH DAILY FOR HIGH BLOOD PRESSURE  pantoprazole  (PROTONIX ) 40 MG DR tablet Take 40 mg by mouth once daily  DULoxetine (CYMBALTA) 30 MG DR capsule Take 1 capsule (30 mg total) by mouth once daily for 180 days 90 capsule 1   Allergies: Hydrocodone -Acetaminophen  Hives and Rash  Hydrochlorothiazide Other (Skin leathery/dryness)  Oxycodone  Hives  Tramadol  Itching   Review of Systems: A comprehensive 14 point ROS was performed, reviewed, and the pertinent orthopaedic findings are documented in the HPI.  Physical Exam: BP (!) 160/100 (BP Location: Left upper arm, Patient Position: Sitting, BP Cuff Size: Large Adult)  Ht 180.3 cm (5' 11)  Wt (!) 163.4 kg (360 lb 3.2 oz)  BMI 50.24 kg/m   General: Well-developed well-nourished male seen in no acute distress.   HEENT: Atraumatic,normocephalic. Pupils are equal and reactive to light. Oropharynx is clear with moist mucosa  Lungs: Clear to auscultation bilaterally   Cardiovascular: Regular rate and rhythm. Normal S1, S2. No murmurs. No appreciable gallops or rubs. Peripheral pulses are palpable.  Abdomen: Soft, non-tender, nondistended. Bowel sounds present  Left knee exam: GAIT: antalgic and uses no assistive devices. ALIGNMENT: mild varus SKIN: unremarkable SWELLING: mild EFFUSION: small WARMTH: no warmth TENDERNESS: moderate tenderness along medial joint line, mild tenderness along the lateral joint line ROM: 0 to 105 degrees  with pain in maximal flexion McMURRAY'S: equivocal PATELLOFEMORAL: normal tracking with no peri-patellar tenderness and negative apprehension sign CREPITUS: Mild patellofemoral crepitance LACHMAN'S: negative PIVOT SHIFT:  Not evaluated ANTERIOR DRAWER: negative POSTERIOR DRAWER: negative VARUS/VALGUS: positive pseudolaxity to varus stressing  Neurological: The patient is alert and oriented Sensation to light touch appears to be intact and within normal limits Gross motor strength appeared to be equal to 5/5  Vascular: Peripheral pulses felt to be palpable. Capillary refill appears to be intact and within normal limits  Imaging: X-rays of the left knee taken in [facility] on 02/19/2023 demonstrate severe degenerative changes, primarily involving the medial compartment with 100% medial joint space narrowing. Moderate degenerative changes of the lateral and patellofemoral compartments also are noted. Overall alignment is mild varus. No fractures, lytic lesions, or abnormal calcifications are noted.   Impression: 1. Degenerative arthrosis left knee  Plan:  The treatment options were discussed with the patient. In addition, patient educational materials were provided regarding the diagnosis and treatment options. The patient is quite frustrated by his symptoms and function limitations, and is ready to consider more aggressive treatment options. Therefore, I have recommended a surgical procedure, specifically a left total knee arthroplasty. The procedure was discussed with the patient, as were the potential risks (including bleeding, infection, nerve and/or blood vessel injury, persistent or recurrent pain, loosening and/or failure of the components, dislocation, need for further surgery, blood clots, strokes, heart attacks and/or arhythmias, pneumonia, etc.) and benefits. The patient states his understanding and wishes to proceed. All of the patient's questions and concerns were answered. He can call any time with further concerns. He will return to work without restrictions. He will follow up post-surgery, routine.     H&P reviewed and patient re-examined. No changes.

## 2023-06-03 NOTE — Progress Notes (Signed)
 Patient is not able to walk the distance required to go the bathroom, or he/she is unable to safely negotiate stairs required to access the bathroom.  A 3in1 BSC will alleviate this problem

## 2023-06-03 NOTE — Discharge Instructions (Addendum)
 Orthopedic discharge instructions: May shower with intact OpSite dressing. Apply ice frequently to knee or use Polar Care. Start Eliquis  1 tablet (2.5 mg) twice daily on Wednesday, 06/04/2023, for 2 weeks, then take aspirin 325 mg twice daily for 4 weeks. Take pain medication as prescribed when needed.  May supplement with ES Tylenol  if necessary. May weight-bear as tolerated on left leg - use walker for balance and support. Follow-up in 10-14 days or as scheduled.

## 2023-06-03 NOTE — Anesthesia Procedure Notes (Addendum)
 Spinal  Patient location during procedure: OR Start time: 06/03/2023 10:36 AM End time: 06/03/2023 10:46 AM Reason for block: surgical anesthesia Staffing Performed by: Jackye Spanner, CRNA Authorized by: Myra Lynwood MATSU, MD   Preanesthetic Checklist Completed: patient identified, IV checked, site marked, risks and benefits discussed, surgical consent, monitors and equipment checked, pre-op evaluation and timeout performed Spinal Block Patient position: sitting Prep: ChloraPrep Patient monitoring: heart rate, continuous pulse ox and blood pressure Approach: midline Location: L4-5 Injection technique: single-shot Needle Needle type: Introducer and Pencan  Needle gauge: 24 G Needle length: 10 cm Assessment Sensory level: T10 Events: CSF return Additional Notes Negative paresthesia. Negative blood return. Positive free-flowing CSF. Expiration date of kit checked and confirmed. Patient tolerated procedure well, without complications. Successful on first attempt, no complications noted. Pt. Tolerated procedure well without any pain/discomfort

## 2023-06-03 NOTE — Anesthesia Preprocedure Evaluation (Signed)
Anesthesia Evaluation  Patient identified by MRN, date of birth, ID band Patient awake    Reviewed: Allergy & Precautions, H&P , NPO status , Patient's Chart, lab work & pertinent test results, reviewed documented beta blocker date and time   Airway Mallampati: III   Neck ROM: full    Dental  (+) Poor Dentition   Pulmonary neg shortness of breath, asthma , neg sleep apnea, Current Smoker and Patient abstained from smoking.   Pulmonary exam normal        Cardiovascular Exercise Tolerance: Good hypertension, On Medications negative cardio ROS Normal cardiovascular exam Rhythm:regular Rate:Normal     Neuro/Psych   Anxiety     negative neurological ROS  negative psych ROS   GI/Hepatic negative GI ROS, Neg liver ROS,,,  Endo/Other    Class 3 obesity  Renal/GU negative Renal ROS  negative genitourinary   Musculoskeletal   Abdominal   Peds  Hematology negative hematology ROS (+)   Anesthesia Other Findings Past Medical History: No date: Anxiety No date: Arthritis No date: Arthritis of knee, degenerative No date: Asthma     Comment:  h/o as a child-does not use inhaler No date: Bilateral leg edema     Comment:  was prescribed Lasix but pt reports on 05-28-23 that he               dose not take this No date: Closed fracture of nasal bones No date: Diverticulitis No date: Hypertension No date: Morbid obesity (HCC) No date: Tobacco use Past Surgical History: No date: CHOLECYSTECTOMY No date: SHOULDER ARTHROSCOPY; Right 11/13/2021: TOTAL KNEE ARTHROPLASTY; Right     Comment:  Procedure: TOTAL KNEE ARTHROPLASTY;  Surgeon: Christena Flake, MD;  Location: [facility] ORS;  Service: Orthopedics;                Laterality: Right; BMI    Body Mass Index: 48.82 kg/m     Reproductive/Obstetrics negative OB ROS                             Anesthesia Physical Anesthesia Plan  ASA:  3  Anesthesia Plan: Spinal   Post-op Pain Management:    Induction:   PONV Risk Score and Plan: 1  Airway Management Planned:   Additional Equipment:   Intra-op Plan:   Post-operative Plan:   Informed Consent: I have reviewed the patients History and Physical, chart, labs and discussed the procedure including the risks, benefits and alternatives for the proposed anesthesia with the patient or authorized representative who has indicated his/her understanding and acceptance.     Dental Advisory Given  Plan Discussed with: CRNA  Anesthesia Plan Comments:        Anesthesia Quick Evaluation

## 2023-06-03 NOTE — Op Note (Signed)
 06/03/2023  1:18 PM  Patient:   Anthony [name]  Pre-Op Diagnosis:   Degenerative joint disease, left knee.  Post-Op Diagnosis:   Same  Procedure:   Left TKA using all-pressfit Zimmer Persona system with a #10 PCR femur, a(n) H-sized  tibial tray with a 14 mm medial congruent E-poly insert, and a 38 x 10 mm all-poly 3-pegged domed patella.  Surgeon:   DOROTHA Reyes Maltos, MD  Assistant:   Gustavo Level, PA-C   Anesthesia:   Spinal  Findings:   As above  Complications:   None  EBL:   20 cc  Fluids:   300 cc crystalloid  UOP:   None  TT:   100 minutes at 300 mmHg  Drains:   None  Closure:   Staples  Implants:   As above  Brief Clinical Note:   The patient is a 43 year old male with a long history of progressively worsening left knee pain. The patient's symptoms have progressed despite medications, activity modification, injections, etc. The patient's history and examination were consistent with advanced degenerative joint disease of the left knee confirmed by plain radiographs. The patient presents at this time for a left total knee arthroplasty.  Procedure:   The patient was brought into the operating room. After adequate spinal anesthesia was obtained, the patient was repositioned in the supine position on the operating room table. The left lower extremity was prepped with ChloraPrep solution and draped sterilely. Preoperative antibiotics were administered. A timeout was performed to verify the appropriate surgical site before the limb was exsanguinated with an Esmarch and the tourniquet inflated to 300 mmHg.   A standard anterior approach to the knee was made through an approximately 6-7 inch incision. The incision was carried down through the subcutaneous tissues to expose superficial retinaculum. This was split the length of the incision and the medial flap elevated sufficiently to expose the medial retinaculum. The medial retinaculum was incised, leaving a 3-4 mm cuff of  tissue on the patella. This was extended distally along the medial border of the patellar tendon and proximally through the medial third of the quadriceps tendon. A subtotal fat pad excision was performed before the soft tissues were elevated off the anteromedial and anterolateral aspects of the proximal tibia to the level of the collateral ligaments. The anterior portions of the medial and lateral menisci were removed, as was the anterior cruciate ligament. With the knee flexed to 90, the external tibial guide was positioned and the appropriate proximal tibial cut made. This piece was taken to the back table where it was measured and found to be optimally replicated by a(n) H-sized component.  Attention was directed to the distal femur. The intramedullary canal was accessed through a 3/8 drill hole. The intramedullary guide was inserted and placed at 5 of valgus alignment. Using the +0 slot, the distal cut was made. The distal femur was measured and found to be optimally replicated by the #10 component. The #10 4-in-1 cutting block was positioned and first the posterior, then the posterior chamfer, the anterior, and finally the anterior chamfer cuts were made after verifying that the anterior cortex would not be notched.   At this point, the posterior portions medial and lateral menisci were removed. A trial reduction was performed using the appropriate femoral and tibial components with the  first the 10 mm, then the 12 mm, and finally the 14 mm  insert. The 14 mm insert demonstrated excellent stability to varus and valgus stressing both  in flexion and extension while permitting full extension. Patellar tracking was assessed and found to be excellent. Therefore, the tibial trial position was marked on the proximal tibia. The patella thickness was measured and found to be 25 mm. Therefore, the appropriate cut was made. The patellar surface was measured and found to be optimally replicated by the 38 mm  component. The three peg holes were drilled in place before the trial button was inserted. Patella tracking was assessed and found to be excellent, passing the no thumb test. The lug holes were drilled into the distal femur before the trial component was removed.  The tibial tray was repositioned before the keel was created using the appropriate tower, reamer, and punch.  The bony surfaces were prepared for cementing by irrigating them thoroughly with sterile saline solution via the jet lavage system. A bone plug was fashioned from some of the bone that had been removed previously and used to plug the distal femoral canal. In addition, a cocktail of 20 cc of Exparel , 30 cc of 0.5% Sensorcaine , 2 cc of Kenalog  40 (80 mg), and 30 mg of Toradol  diluted out to 90 cc with normal saline was injected into the postero-medial and postero-lateral aspects of the knee, the medial and lateral gutter regions, and the peri-incisional tissues to help with postoperative analgesia.   The tibial tray was impacted into position in first. Next, the femoral component was impacted into place. The 14 mm trial insert was snapped into place with care taken to ensure appropriate locking of the insert. Finally, the patella component was positioned and pressed into place using the appropriate patellar clamp. The knee was placed through a range of motion with the findings as described above.  The wound was copiously irrigated with sterile saline solution using the jet lavage system before the quadriceps tendon and retinacular layer were reapproximated using #0 Vicryl interrupted sutures. The superficial retinacular layer also was closed using a running #0 Vicryl suture. The subcutaneous tissues were closed in several layers using 2-0 Vicryl interrupted sutures. The skin was closed using staples. A sterile honeycomb dressing was applied to the skin before the leg was wrapped with an Ace wrap to accommodate the Polar Care device. The  patient was then awakened and returned to the recovery room in satisfactory condition after tolerating the procedure well.

## 2023-06-03 NOTE — Evaluation (Signed)
 Physical Therapy Evaluation Patient Details Name: Anthony [name] MRN: [id number] DOB: 08-Mar-1981 Today's Date: 06/03/2023  History of Present Illness  Pt is a 43 yo M diagnosed with degenerative joint disease of the left knee and is s/p elective TKA.  PMH includes R TKA, arthritis, asthma without asthmaticus, HTN, and diverticulitis.   Clinical Impression  Pt was pleasant and motivated to participate during the session and put forth good effort throughout. Pt required no physical assistance during the session but did require some cuing for general sequencing most notably with transfers and stair training.  Pt able to ambulate initially with step-to sequencing that quickly progressed to step-through sequencing with no overt LOB or instability and then presented with good control, stability, and carryover of proper sequencing during stair training.  During ambulation after practicing stairs, however, the pt presented with one moderate L knee buckle and 1-2 minor knee buckles that he was able to self-correct and was returned to sitting.  Pt informed that delaying discharge and staying the night would be recommended secondary to L knee buckling.  Pt adamant about going home this date with Dr. Edie contacted who requested pt be trained on using LLE knee immobilizer prior to discharge for increased safety.  This PT returned once KI obtained and practiced transfers, gait, and stairs with KI donned with pt demonstrating good control and stability with all tasks.  Pt reported no adverse symptoms during the session with SpO2 in the low 90s and HR  WNL on room air.  Pt will benefit from continued PT services upon discharge to safely address deficits listed in patient problem list for decreased caregiver assistance and eventual return to PLOF.           If plan is discharge home, recommend the following: A little help with walking and/or transfers;A little help with bathing/dressing/bathroom;Assistance with  cooking/housework;Assist for transportation;Help with stairs or ramp for entrance   Can travel by private vehicle        Equipment Recommendations Rolling walker (2 wheels);Other (comment) (Pt declined BSC; needs bariatric RW)  Recommendations for Other Services       Functional Status Assessment Patient has had a recent decline in their functional status and demonstrates the ability to make significant improvements in function in a reasonable and predictable amount of time.     Precautions / Restrictions Precautions Precautions: Fall Restrictions Weight Bearing Restrictions Per Provider Order: Yes LLE Weight Bearing Per Provider Order: Weight bearing as tolerated Other Position/Activity Restrictions: LLE KI as needed per Dr. Edie      Mobility  Bed Mobility Overal bed mobility: Modified Independent             General bed mobility comments: Min extra time and effort only    Transfers Overall transfer level: Needs assistance Equipment used: Rolling walker (2 wheels) Transfers: Sit to/from Stand Sit to Stand: Contact guard assist           General transfer comment: Min multi-modal cues for sequencing both with and without KI donned    Ambulation/Gait Ambulation/Gait assistance: Contact guard assist Gait Distance (Feet): 100 Feet  x 1 without a KI, 30 Feet x 1 with KI donned to LLE Assistive device: Rolling walker (2 wheels) Gait Pattern/deviations: Step-through pattern, Step-to pattern, Decreased step length - right, Decreased stance time - left Gait velocity: decreased     General Gait Details: Step-to pattern initially progressing to step-through pattern with mildly reduced cadence  Stairs Stairs: Yes Stairs assistance: Contact  guard assist Stair Management: Two rails, Step to pattern, Forwards Number of Stairs: 4 General stair comments: Pt able to ascend/descend 4 steps once without and once with LLE KI with multi-modal cues for  sequencing  Wheelchair Mobility     Tilt Bed    Modified Rankin (Stroke Patients Only)       Balance Overall balance assessment: Needs assistance   Sitting balance-Leahy Scale: Normal     Standing balance support: Bilateral upper extremity supported, During functional activity Standing balance-Leahy Scale: Good                               Pertinent Vitals/Pain Pain Assessment Pain Assessment: 0-10 Pain Score: 4  Pain Location: L knee Pain Descriptors / Indicators: Sore Pain Intervention(s): Repositioned, Ice applied, Premedicated before session, Monitored during session    Home Living Family/patient expects to be discharged to:: Private residence Living Arrangements: Spouse/significant other;Children Available Help at Discharge: Family;Available 24 hours/day Type of Home: House Home Access: Stairs to enter Entrance Stairs-Rails: Right;Left;Can reach both Entrance Stairs-Number of Steps: 4   Home Layout: One level Home Equipment: Shower seat Additional Comments: Pt no longer owns DME from prior TKA    Prior Function Prior Level of Function : Independent/Modified Independent             Mobility Comments: Ind amb without AD community distances, no fall history, works as a naval architect ADLs Comments: Ind with ADLs     Extremity/Trunk Assessment   Upper Extremity Assessment Upper Extremity Assessment: Overall WFL for tasks assessed    Lower Extremity Assessment Lower Extremity Assessment: LLE deficits/detail LLE Deficits / Details: BLE ankle strength, AROM, and sensation to light touch grossly WNL LLE: Unable to fully assess due to pain LLE Sensation: WNL LLE Coordination: WNL       Communication   Communication Communication: No apparent difficulties Cueing Techniques: Verbal cues;Visual cues  Cognition Arousal: Alert Behavior During Therapy: WFL for tasks assessed/performed Overall Cognitive Status: Within Functional Limits for  tasks assessed                                          General Comments      Exercises Total Joint Exercises Quad Sets: AROM, Strengthening, Left, 5 reps, 10 reps Straight Leg Raises: AROM, Strengthening, Left, 10 reps Long Arc Quad: AROM, Strengthening, Left, 5 reps, 10 reps Knee Flexion: AROM, Strengthening, Left, 5 reps, 10 reps Goniometric ROM: L knee AROM: 5-70 deg Marching in Standing: AROM, Strengthening, Both, 5 reps, Standing Other Exercises Other Exercises: HEP education per handout Other Exercises: Car transfer sequencing education Other Exercises: Education and training on proper sequencing with transfers and stairs with LLE KI donned   Assessment/Plan    PT Assessment Patient needs continued PT services  PT Problem List Decreased strength;Decreased range of motion;Decreased activity tolerance;Decreased balance;Decreased mobility;Decreased knowledge of use of DME;Pain       PT Treatment Interventions DME instruction;Gait training;Stair training;Functional mobility training;Therapeutic activities;Therapeutic exercise;Balance training;Patient/family education    PT Goals (Current goals can be found in the Care Plan section)  Acute Rehab PT Goals Patient Stated Goal: To walk without pain PT Goal Formulation: With patient Time For Goal Achievement: 06/16/23 Potential to Achieve Goals: Good    Frequency BID     Co-evaluation  AM-PAC PT 6 Clicks Mobility  Outcome Measure Help needed turning from your back to your side while in a flat bed without using bedrails?: None Help needed moving from lying on your back to sitting on the side of a flat bed without using bedrails?: None Help needed moving to and from a bed to a chair (including a wheelchair)?: A Little Help needed standing up from a chair using your arms (e.g., wheelchair or bedside chair)?: A Little Help needed to walk in hospital room?: A Little Help needed climbing  3-5 steps with a railing? : A Little 6 Click Score: 20    End of Session Equipment Utilized During Treatment: Gait belt;Left knee immobilizer Activity Tolerance: Patient tolerated treatment well Patient left: Other (comment) (Pt left with nursing in w/c preparing for discharge) Nurse Communication: Mobility status;Weight bearing status PT Visit Diagnosis: Other abnormalities of gait and mobility (R26.89);Muscle weakness (generalized) (M62.81);Pain Pain - Right/Left: Left Pain - part of body: Knee    Time: 1553-1650 (and 5:19-5:30 pm with break to obtain KI) PT Time Calculation (min) (ACUTE ONLY): 57 min   Charges:   PT Evaluation $PT Eval Moderate Complexity: 1 Mod PT Treatments $Gait Training: 23-37 mins PT General Charges $$ ACUTE PT VISIT: 1 Visit       D. Glendia Bertin PT, DPT 06/03/23, 5:53 PM

## 2023-06-05 ENCOUNTER — Encounter: Payer: Self-pay | Admitting: Surgery

## 2023-06-05 NOTE — Anesthesia Postprocedure Evaluation (Signed)
 Anesthesia Post Note  Patient: Anthony [name]  Procedure(s) Performed: TOTAL KNEE ARTHROPLASTY (Left: Knee)  Patient location during evaluation: PACU Anesthesia Type: Spinal Level of consciousness: awake and alert Pain management: pain level controlled Vital Signs Assessment: post-procedure vital signs reviewed and stable Respiratory status: spontaneous breathing, nonlabored ventilation, respiratory function stable and patient connected to nasal cannula oxygen Cardiovascular status: blood pressure returned to baseline and stable Postop Assessment: no apparent nausea or vomiting Anesthetic complications: no   No notable events documented.   Last Vitals:  Vitals:   06/03/23 1445 06/03/23 1646  BP: 110/63 112/66  Pulse:  88  Resp:  14  Temp: (!) 36.2 C 36.7 C  SpO2: 95% 94%    Last Pain:  Vitals:   06/04/23 0944  TempSrc:   PainSc: 0-No pain                 Lynwood KANDICE Clause

## 2023-06-08 ENCOUNTER — Encounter: Payer: Self-pay | Admitting: Student

## 2023-07-10 ENCOUNTER — Other Ambulatory Visit: Payer: Self-pay

## 2023-07-10 ENCOUNTER — Emergency Department

## 2023-07-10 DIAGNOSIS — N132 Hydronephrosis with renal and ureteral calculous obstruction: Secondary | ICD-10-CM | POA: Diagnosis not present

## 2023-07-10 DIAGNOSIS — N2889 Other specified disorders of kidney and ureter: Secondary | ICD-10-CM | POA: Diagnosis not present

## 2023-07-10 DIAGNOSIS — R1032 Left lower quadrant pain: Secondary | ICD-10-CM | POA: Diagnosis present

## 2023-07-10 LAB — CBC WITH DIFFERENTIAL/PLATELET
Abs Immature Granulocytes: 0.03 10*3/uL (ref 0.00–0.07)
Basophils Absolute: 0.1 10*3/uL (ref 0.0–0.1)
Basophils Relative: 0 %
Eosinophils Absolute: 0.3 10*3/uL (ref 0.0–0.5)
Eosinophils Relative: 3 %
HCT: 43.5 % (ref 39.0–52.0)
Hemoglobin: 14.6 g/dL (ref 13.0–17.0)
Immature Granulocytes: 0 %
Lymphocytes Relative: 16 %
Lymphs Abs: 1.8 10*3/uL (ref 0.7–4.0)
MCH: 31 pg (ref 26.0–34.0)
MCHC: 33.6 g/dL (ref 30.0–36.0)
MCV: 92.4 fL (ref 80.0–100.0)
Monocytes Absolute: 1 10*3/uL (ref 0.1–1.0)
Monocytes Relative: 8 %
Neutro Abs: 8.3 10*3/uL — ABNORMAL HIGH (ref 1.7–7.7)
Neutrophils Relative %: 73 %
Platelets: 205 10*3/uL (ref 150–400)
RBC: 4.71 MIL/uL (ref 4.22–5.81)
RDW: 12.6 % (ref 11.5–15.5)
WBC: 11.4 10*3/uL — ABNORMAL HIGH (ref 4.0–10.5)
nRBC: 0 % (ref 0.0–0.2)

## 2023-07-10 LAB — BASIC METABOLIC PANEL
Anion gap: 9 (ref 5–15)
BUN: 21 mg/dL — ABNORMAL HIGH (ref 6–20)
CO2: 25 mmol/L (ref 22–32)
Calcium: 9.1 mg/dL (ref 8.9–10.3)
Chloride: 105 mmol/L (ref 98–111)
Creatinine, Ser: 1.22 mg/dL (ref 0.61–1.24)
GFR, Estimated: >60 mL/min (ref >60)
Glucose, Bld: 132 mg/dL — ABNORMAL HIGH (ref 70–99)
Potassium: 3.7 mmol/L (ref 3.5–5.1)
Sodium: 139 mmol/L (ref 135–145)

## 2023-07-10 LAB — URINALYSIS, ROUTINE W REFLEX MICROSCOPIC
Bacteria, UA: NONE SEEN
Bilirubin Urine: NEGATIVE
Glucose, UA: NEGATIVE mg/dL
Ketones, ur: NEGATIVE mg/dL
Leukocytes,Ua: NEGATIVE
Nitrite: NEGATIVE
Protein, ur: NEGATIVE mg/dL
Specific Gravity, Urine: 1.03 (ref 1.005–1.030)
pH: 5 (ref 5.0–8.0)

## 2023-07-10 NOTE — ED Triage Notes (Addendum)
 Pt reports left side flank pain,  hx diverticulitis. Pt states he has had dysuria, decreased urine output and hematuria. Pt reports he also has hx kidney stones. Pt had total knee replacement on 2/3 left knee.

## 2023-07-11 ENCOUNTER — Emergency Department
Admission: EM | Admit: 2023-07-11 | Discharge: 2023-07-11 | Disposition: A | Discharge status: Home / Self Care | Attending: Emergency Medicine | Admitting: Emergency Medicine

## 2023-07-11 DIAGNOSIS — N289 Disorder of kidney and ureter, unspecified: Secondary | ICD-10-CM

## 2023-07-11 DIAGNOSIS — N200 Calculus of kidney: Secondary | ICD-10-CM

## 2023-07-11 MED ORDER — KETOROLAC TROMETHAMINE 60 MG/2ML IM SOLN
60.0000 mg | Freq: Once | INTRAMUSCULAR | Status: AC
  Administered 2023-07-11: 60 mg via INTRAMUSCULAR
  Filled 2023-07-11: qty 2

## 2023-07-11 MED ORDER — NAPROXEN 500 MG PO TABS: 500.0000 mg | ORAL_TABLET | Freq: Two times a day (BID) | ORAL | 0 refills | Status: AC

## 2023-07-11 MED ORDER — TAMSULOSIN HCL 0.4 MG PO CAPS: 0.4000 mg | ORAL_CAPSULE | Freq: Every day | ORAL | 0 refills | Status: AC

## 2023-07-11 MED ORDER — ONDANSETRON HCL 4 MG PO TABS: 4.0000 mg | ORAL_TABLET | Freq: Three times a day (TID) | ORAL | 0 refills | Status: AC | PRN

## 2023-07-11 NOTE — ED Provider Notes (Signed)
 [facility] Provider Note    Event Date/Time   First MD Initiated Contact with Patient 07/11/23 0118     (approximate)   History   Flank Pain   HPI BENSYN BORNEMANN II is a 43 y.o. male presenting today for left flank pain.  Patient states yesterday afternoon he had onset of left-sided flank pain.  It is also present in the left lower quadrant of his abdomen.  Noted darker colored urine but denied any dysuria.  Otherwise no fevers, vomiting, nausea, other abdominal pain, constipation, diarrhea.  Prior history of kidney stones and diverticulitis.     Physical Exam   Triage Vital Signs: ED Triage Vitals  Encounter Vitals Group     BP 07/10/23 2216 (!) 162/100     Systolic BP Percentile --      Diastolic BP Percentile --      Pulse Rate 07/10/23 2216 (!) 103     Resp 07/10/23 2216 18     Temp 07/10/23 2216 98.4 F (36.9 C)     Temp Source 07/10/23 2216 Oral     SpO2 07/10/23 2216 98 %     Weight 07/10/23 2220 (!) 335 lb (152 kg)     Height 07/10/23 2220 6' (1.829 m)     Head Circumference --      Peak Flow --      Pain Score 07/10/23 2220 10     Pain Loc --      Pain Education --      Exclude from Growth Chart --     Most recent vital signs: Vitals:   07/10/23 2216 07/11/23 0127  BP: (!) 162/100 (!) 138/101  Pulse: (!) 103 99  Resp: 18 18  Temp: 98.4 F (36.9 C)   SpO2: 98% 97%   Physical Exam: I have reviewed the vital signs and nursing notes. General: Awake, alert, no acute distress.  Nontoxic appearing. Head:  Atraumatic, normocephalic.   ENT:  EOM intact, PERRL. Oral mucosa is pink and moist with no lesions. Neck: Neck is supple with full range of motion, No meningeal signs. Cardiovascular:  RRR, No murmurs. Peripheral pulses palpable and equal bilaterally. Respiratory:  Symmetrical chest wall expansion.  No rhonchi, rales, or wheezes.  Good air movement throughout.  No use of accessory muscles.   Musculoskeletal:  No cyanosis or  edema. Moving extremities with full ROM Abdomen:  Soft, mild left-sided CVA tenderness palpation, otherwise nontender abdomen, nondistended. Neuro:  GCS 15, moving all four extremities, interacting appropriately. Speech clear. Psych:  Calm, appropriate.   Skin:  Warm, dry, no rash.    ED Results / Procedures / Treatments   Labs (all labs ordered are listed, but only abnormal results are displayed) Labs Reviewed  CBC WITH DIFFERENTIAL/PLATELET - Abnormal; Notable for the following components:      Result Value   WBC 11.4 (*)    Neutro Abs 8.3 (*)    All other components within normal limits  BASIC METABOLIC PANEL - Abnormal; Notable for the following components:   Glucose, Bld 132 (*)    BUN 21 (*)    All other components within normal limits  URINALYSIS, ROUTINE W REFLEX MICROSCOPIC - Abnormal; Notable for the following components:   Color, Urine YELLOW (*)    APPearance CLEAR (*)    Hgb urine dipstick SMALL (*)    All other components within normal limits     EKG    RADIOLOGY Independently interpreted CT abdomen/pelvis with  evidence of 3 mm stone in the distal left ureter   PROCEDURES:  Critical Care performed: No  Procedures   MEDICATIONS ORDERED IN ED: Medications  ketorolac (TORADOL) injection 60 mg (60 mg Intramuscular Given 07/11/23 0138)     IMPRESSION / MDM / ASSESSMENT AND PLAN / ED COURSE  I reviewed the triage vital signs and the nursing notes.                              Differential diagnosis includes, but is not limited to, nephrolithiasis, acute cystitis, pyelonephritis, diverticulitis  Patient's presentation is most consistent with acute complicated illness / injury requiring diagnostic workup.  Patient is a 43 year old male presenting today for acute onset left flank pain with no other associated symptoms.  CBC and BMP largely unremarkable.  Urine with hemoglobin present but no signs of infection.  Ultimately, CT abdomen/pelvis shows  evidence of 3 mm kidney stone on the left side which I suspect is the source of his symptoms today.  Given Toradol for pain symptoms with improvement.  Otherwise stable for discharge with expected management of kidney stone outpatient.  Notified patient of the small lesion seen on his right kidney for him to have follow-up with outpatient ultrasound.  Otherwise given strict return precautions for worsening symptoms.     FINAL CLINICAL IMPRESSION(S) / ED DIAGNOSES   Final diagnoses:  Nephrolithiasis  Lesion of right native kidney     Rx / DC Orders   ED Discharge Orders     None        Note:  This document was prepared using Dragon voice recognition software and may include unintentional dictation errors.   Janith Lima, MD 07/11/23 [phone]

## 2023-07-11 NOTE — Discharge Instructions (Signed)
 Please follow-up with your primary care provider to get an outpatient ultrasound of your right kidney to evaluate for the spots seen on CT.  Otherwise take your medications as prescribed and return to the ED for any severe worsening symptoms.

## 2024-01-08 ENCOUNTER — Other Ambulatory Visit (INDEPENDENT_AMBULATORY_CARE_PROVIDER_SITE_OTHER): Payer: Self-pay | Admitting: Nurse Practitioner

## 2024-01-08 ENCOUNTER — Other Ambulatory Visit (INDEPENDENT_AMBULATORY_CARE_PROVIDER_SITE_OTHER): Payer: Self-pay | Admitting: Pediatrics

## 2024-01-08 DIAGNOSIS — R2242 Localized swelling, mass and lump, left lower limb: Secondary | ICD-10-CM

## 2024-01-08 DIAGNOSIS — M7989 Other specified soft tissue disorders: Secondary | ICD-10-CM

## 2024-01-14 ENCOUNTER — Ambulatory Visit (INDEPENDENT_AMBULATORY_CARE_PROVIDER_SITE_OTHER)

## 2024-01-14 DIAGNOSIS — M7989 Other specified soft tissue disorders: Secondary | ICD-10-CM
